# Patient Record
Sex: Male | Born: 1955 | Race: Black or African American | Hispanic: No | Smoking: Never smoker
Health system: Southern US, Community
[De-identification: ages and names within clinical notes are randomized; demographics above are authoritative.]

## PROBLEM LIST (undated history)

## (undated) DIAGNOSIS — C801 Malignant (primary) neoplasm, unspecified: Secondary | ICD-10-CM

## (undated) DIAGNOSIS — C61 Malignant neoplasm of prostate: Secondary | ICD-10-CM

## (undated) DIAGNOSIS — Z21 Asymptomatic human immunodeficiency virus [HIV] infection status: Secondary | ICD-10-CM

## (undated) DIAGNOSIS — I4892 Unspecified atrial flutter: Secondary | ICD-10-CM

## (undated) DIAGNOSIS — B20 Human immunodeficiency virus [HIV] disease: Secondary | ICD-10-CM

## (undated) DIAGNOSIS — I10 Essential (primary) hypertension: Secondary | ICD-10-CM

## (undated) HISTORY — PX: BACK SURGERY: SHX140

## (undated) HISTORY — PX: CARPAL TUNNEL RELEASE: SHX101

---

## 2009-10-13 ENCOUNTER — Emergency Department (HOSPITAL_COMMUNITY): Admission: EM | Admit: 2009-10-13 | Discharge: 2009-10-13 | Payer: Self-pay | Admitting: Emergency Medicine

## 2011-02-02 ENCOUNTER — Ambulatory Visit: Payer: Self-pay

## 2011-03-25 LAB — URINALYSIS, ROUTINE W REFLEX MICROSCOPIC
Glucose, UA: NEGATIVE mg/dL
Hgb urine dipstick: NEGATIVE
Protein, ur: NEGATIVE mg/dL
Specific Gravity, Urine: 1.024 (ref 1.005–1.030)
pH: 6 (ref 5.0–8.0)

## 2011-03-25 LAB — URINE MICROSCOPIC-ADD ON

## 2013-12-16 ENCOUNTER — Emergency Department: Payer: Self-pay | Admitting: Emergency Medicine

## 2013-12-18 ENCOUNTER — Emergency Department (HOSPITAL_COMMUNITY)
Admission: EM | Admit: 2013-12-18 | Discharge: 2013-12-18 | Disposition: A | Discharge status: Home / Self Care | Payer: Medicare Other | Attending: Emergency Medicine | Admitting: Emergency Medicine

## 2013-12-18 ENCOUNTER — Encounter (HOSPITAL_COMMUNITY): Payer: Self-pay | Admitting: Emergency Medicine

## 2013-12-18 DIAGNOSIS — M25579 Pain in unspecified ankle and joints of unspecified foot: Secondary | ICD-10-CM | POA: Insufficient documentation

## 2013-12-18 DIAGNOSIS — M79672 Pain in left foot: Secondary | ICD-10-CM

## 2013-12-18 DIAGNOSIS — I10 Essential (primary) hypertension: Secondary | ICD-10-CM | POA: Insufficient documentation

## 2013-12-18 HISTORY — DX: Essential (primary) hypertension: I10

## 2013-12-18 MED ORDER — BACITRACIN 500 UNIT/GM EX OINT: 1.0000 "application " | TOPICAL_OINTMENT | Freq: Two times a day (BID) | CUTANEOUS | Status: DC
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
## 2013-12-18 NOTE — ED Provider Notes (Signed)
Medical screening examination/treatment/procedure(s) were performed by non-physician practitioner and as supervising physician I was immediately available for consultation/collaboration.  EKG Interpretation   None         Layla Maw Josejuan Hoaglin, DO 12/18/13 [phone]

## 2015-03-01 ENCOUNTER — Emergency Department (INDEPENDENT_AMBULATORY_CARE_PROVIDER_SITE_OTHER)
Admission: EM | Admit: 2015-03-01 | Discharge: 2015-03-01 | Disposition: A | Discharge status: Home / Self Care | Payer: Self-pay | Source: Home / Self Care | Attending: Family Medicine | Admitting: Family Medicine

## 2015-03-01 ENCOUNTER — Encounter (HOSPITAL_COMMUNITY): Payer: Self-pay | Admitting: *Deleted

## 2015-03-01 DIAGNOSIS — T148 Other injury of unspecified body region: Secondary | ICD-10-CM

## 2015-03-01 DIAGNOSIS — T148XXA Other injury of unspecified body region, initial encounter: Secondary | ICD-10-CM
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
## 2015-03-01 NOTE — Discharge Instructions (Signed)
Thank you for coming in today. Use tylenol , ibuprofen or aleve for pain as need.  Take oxycodone when at rest as needed.  Follow up with Dr. Tamala Julian if not better.  Use a heating pad.    Contusion A contusion is the result of an injury to the skin and underlying tissues and is usually caused by direct trauma. The injury results in the appearance of a bruise on the skin overlying the injured tissues. Contusions cause rupture and bleeding of the small capillaries and blood vessels and affect function, because the bleeding infiltrates muscles, tendons, nerves, or other soft tissues.  SYMPTOMS   Swelling and often a hard lump in the injured area, either superficial or deep.  Pain and tenderness over the area of the contusion.  Feeling of firmness when pressure is exerted over the contusion.  Discoloration under the skin, beginning with redness and progressing to the characteristic "black and blue" bruise. CAUSES  A contusion is typically the result of direct trauma. This is often by a blunt object.  RISK INCREASES WITH:  Sports that have a high likelihood of trauma (football, boxing, ice hockey, soccer, field hockey, martial arts, basketball, and baseball).  Sports that make falling from a height likely (high-jumping, pole-vaulting, skating, or gymnastics).  Any bleeding disorder (hemophilia) or taking medications that affect clotting (aspirin, nonsteroidal anti-inflammatory medications, or warfarin [Coumadin]).  Inadequate protection of exposed areas during contact sports. PREVENTION  Maintain physical fitness:  Joint and muscle flexibility.  Strength and endurance.  Coordination.  Wear proper protective equipment. Make sure it fits correctly. PROGNOSIS  Contusions typically heal without any complications. Healing time varies with the severity of injury and intake of medications that affect clotting. Contusions usually heal in 1 to 4 weeks. RELATED COMPLICATIONS   Damage to  nearby nerves or blood vessels, causing numbness, coldness, or paleness.  Compartment syndrome.  Bleeding into the soft tissues that leads to disability.  Infiltrative-type bleeding, leading to the calcification and impaired function of the injured muscle (rare).  Prolonged healing time if usual activities are resumed too soon.  Infection if the skin over the injury site is broken.  Fracture of the bone underlying the contusion.  Stiffness in the joint where the injured muscle crosses. TREATMENT  Treatment initially consists of resting the injured area as well as medication and ice to reduce inflammation. The use of a compression bandage may also be helpful in minimizing inflammation. As pain diminishes and movement is tolerated, the joint where the affected muscle crosses should be moved to prevent stiffness and the shortening (contracture) of the joint. Movement of the joint should begin as soon as possible. It is also important to work on maintaining strength within the affected muscles. Occasionally, extra padding over the area of contusion may be recommended before returning to sports, particularly if re-injury is likely.  MEDICATION   If pain relief is necessary these medications are often recommended:  Nonsteroidal anti-inflammatory medications, such as aspirin and ibuprofen.  Other minor pain relievers, such as acetaminophen, are often recommended.  Prescription pain relievers may be given by your caregiver. Use only as directed and only as much as you need. HEAT AND COLD  Cold treatment (icing) relieves pain and reduces inflammation. Cold treatment should be applied for 10 to 15 minutes every 2 to 3 hours for inflammation and pain and immediately after any activity that aggravates your symptoms. Use ice packs or an ice massage. (To do an ice massage fill a large styrofoam  cup with water and freeze. Tear a small amount of foam from the top so ice protrudes. Massage ice firmly  over the injured area in a circle about the size of a softball.)  Heat treatment may be used prior to performing the stretching and strengthening activities prescribed by your caregiver, physical therapist, or athletic trainer. Use a heat pack or a warm soak. SEEK MEDICAL CARE IF:   Symptoms get worse or do not improve despite treatment in a few days.  You have difficulty moving a joint.  Any extremity becomes extremely painful, numb, pale, or cool (This is an emergency!).  Medication produces any side effects (bleeding, upset stomach, or allergic reaction).  Signs of infection (drainage from skin, headache, muscle aches, dizziness, fever, or general ill feeling) occur if skin was broken. Document Released: 12/06/2005 Document Revised: 02/28/2012 Document Reviewed: 03/20/2009 [facility] Patient Information 2015 [place], [place]. This information is not intended to replace advice given to you by your health care provider. Make sure you discuss any questions you have with your health care provider.

## 2015-03-24 ENCOUNTER — Ambulatory Visit (INDEPENDENT_AMBULATORY_CARE_PROVIDER_SITE_OTHER): Payer: Medicare Other | Admitting: Family Medicine

## 2015-03-24 ENCOUNTER — Encounter: Payer: Self-pay | Admitting: Family Medicine

## 2015-03-24 VITALS — BP 146/88 | HR 78 | Ht 73.0 in | Wt 271.0 lb

## 2015-03-24 DIAGNOSIS — M7061 Trochanteric bursitis, right hip: Secondary | ICD-10-CM | POA: Diagnosis not present

## 2015-03-24 DIAGNOSIS — M6248 Contracture of muscle, other site: Secondary | ICD-10-CM

## 2015-03-24 DIAGNOSIS — M62838 Other muscle spasm: Secondary | ICD-10-CM | POA: Insufficient documentation

## 2015-03-24 MED ORDER — PREDNISONE 50 MG PO TABS: 50.0000 mg | ORAL_TABLET | Freq: Every day | ORAL | Status: DC

## 2015-03-24 NOTE — Assessment & Plan Note (Signed)
Patient has still some tightness of the neck overall. Because patient does need to get back to work patient elected to try prednisone. I do not feel that further imaging is necessary with patient not having any radicular symptoms at this time. 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 We discussed that patient needs to stop the medication and can switch to anti-inflammatories. We discussed icing regimen. Patient given home exercises well. Patient come back in 1 week. At that time as long as patient is off the pain medication is feeling much better we'll get him back to his work.

## 2015-03-24 NOTE — Assessment & Plan Note (Signed)
Discussed with patient that this is more secondary to his inactivity. Patient given some home exercises to try to increase patient's activity. Patient had a negative straight leg test. There is a possibility with the differential of including adjacent segment disease with patient having a lumbar fusion multiple years ago. Patient having worsening symptoms advance imaging may be necessary. Patient though was given prednisone and hopefully will respond well. We discussed the icing regimen as well as topical anti-inflammatories. Patient and will come back and see me again in 1 week and if pain is still there will consider injection.

## 2015-03-24 NOTE — Patient Instructions (Addendum)
Good to see you Ice 20 minutes 2 times daily. Usually after activity and before bed. Exercises 3 times a week. Alternate neck and hip Vitamin D 2000 IU daily Turmeric 500mg  twice daily Prednisone daily for 5 days Stop oxycodone and valium.  See me again next Tuesday and we will get you back to work.

## 2015-03-24 NOTE — Progress Notes (Signed)
Pre visit review using our clinic review tool, if applicable. No additional management support is needed unless otherwise documented below in the visit note. 
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
## 2015-04-01 ENCOUNTER — Ambulatory Visit: Payer: Medicare Other | Admitting: Family Medicine

## 2015-05-18 ENCOUNTER — Encounter (HOSPITAL_COMMUNITY): Payer: Self-pay

## 2015-05-18 ENCOUNTER — Emergency Department (HOSPITAL_COMMUNITY): Payer: Medicare Other

## 2015-05-18 ENCOUNTER — Emergency Department (HOSPITAL_COMMUNITY)
Admission: EM | Admit: 2015-05-18 | Discharge: 2015-05-18 | Disposition: A | Discharge status: Home / Self Care | Payer: Medicare Other | Attending: Emergency Medicine | Admitting: Emergency Medicine

## 2015-05-18 DIAGNOSIS — Z79899 Other long term (current) drug therapy: Secondary | ICD-10-CM | POA: Insufficient documentation

## 2015-05-18 DIAGNOSIS — I10 Essential (primary) hypertension: Secondary | ICD-10-CM | POA: Insufficient documentation

## 2015-05-18 DIAGNOSIS — Z8546 Personal history of malignant neoplasm of prostate: Secondary | ICD-10-CM | POA: Diagnosis not present

## 2015-05-18 DIAGNOSIS — M549 Dorsalgia, unspecified: Secondary | ICD-10-CM | POA: Insufficient documentation

## 2015-05-18 DIAGNOSIS — R319 Hematuria, unspecified: Secondary | ICD-10-CM

## 2015-05-18 DIAGNOSIS — R109 Unspecified abdominal pain: Secondary | ICD-10-CM

## 2015-05-18 DIAGNOSIS — Z7982 Long term (current) use of aspirin: Secondary | ICD-10-CM | POA: Diagnosis not present

## 2015-05-18 DIAGNOSIS — Z21 Asymptomatic human immunodeficiency virus [HIV] infection status: Secondary | ICD-10-CM | POA: Diagnosis not present

## 2015-05-18 HISTORY — DX: Asymptomatic human immunodeficiency virus (hiv) infection status: Z21

## 2015-05-18 HISTORY — DX: Human immunodeficiency virus (HIV) disease: B20

## 2015-05-18 HISTORY — DX: Malignant (primary) neoplasm, unspecified: C80.1

## 2015-05-18 HISTORY — DX: Malignant neoplasm of prostate: C61

## 2015-05-18 LAB — URINALYSIS, ROUTINE W REFLEX MICROSCOPIC
BILIRUBIN URINE: NEGATIVE
Glucose, UA: NEGATIVE mg/dL
KETONES UR: NEGATIVE mg/dL
NITRITE: NEGATIVE
PROTEIN: NEGATIVE mg/dL
Specific Gravity, Urine: 1.026 (ref 1.005–1.030)
Urobilinogen, UA: 0.2 mg/dL (ref 0.0–1.0)
pH: 5.5 (ref 5.0–8.0)

## 2015-05-18 LAB — I-STAT CHEM 8, ED
BUN: 16 mg/dL (ref 6–20)
CREATININE: 1.3 mg/dL — AB (ref 0.61–1.24)
Calcium, Ion: 1.26 mmol/L — ABNORMAL HIGH (ref 1.12–1.23)
Chloride: 103 mmol/L (ref 101–111)
Glucose, Bld: 104 mg/dL — ABNORMAL HIGH (ref 65–99)
HCT: 46 % (ref 39.0–52.0)
HEMOGLOBIN: 15.6 g/dL (ref 13.0–17.0)
Potassium: 4 mmol/L (ref 3.5–5.1)
SODIUM: 141 mmol/L (ref 135–145)
TCO2: 25 mmol/L (ref 0–100)

## 2015-05-18 LAB — CBC WITH DIFFERENTIAL/PLATELET
Basophils Absolute: 0 10*3/uL (ref 0.0–0.1)
Basophils Relative: 0 % (ref 0–1)
Eosinophils Absolute: 0 10*3/uL (ref 0.0–0.7)
Eosinophils Relative: 0 % (ref 0–5)
HCT: 40.9 % (ref 39.0–52.0)
Hemoglobin: 14.3 g/dL (ref 13.0–17.0)
LYMPHS ABS: 1.3 10*3/uL (ref 0.7–4.0)
LYMPHS PCT: 19 % (ref 12–46)
MCH: 30.4 pg (ref 26.0–34.0)
MCHC: 35 g/dL (ref 30.0–36.0)
MCV: 86.8 fL (ref 78.0–100.0)
Monocytes Absolute: 0.9 10*3/uL (ref 0.1–1.0)
Monocytes Relative: 13 % — ABNORMAL HIGH (ref 3–12)
NEUTROS ABS: 4.6 10*3/uL (ref 1.7–7.7)
Neutrophils Relative %: 68 % (ref 43–77)
PLATELETS: 197 10*3/uL (ref 150–400)
RBC: 4.71 MIL/uL (ref 4.22–5.81)
RDW: 12.9 % (ref 11.5–15.5)
WBC: 6.8 10*3/uL (ref 4.0–10.5)

## 2015-05-18 LAB — URINE MICROSCOPIC-ADD ON

## 2015-05-18 NOTE — ED Notes (Signed)
Patient c/o right flank pain and hematuria x 5 days. Patient states he had the same symptoms a month ago.

## 2015-05-18 NOTE — Discharge Instructions (Signed)
Continue your pain medications. Please follow up with urology for further evaluation of your hematuria. Return if worsening.    Flank Pain Flank pain refers to pain that is located on the side of the body between the upper abdomen and the back. The pain may occur over a short period of time (acute) or may be long-term or reoccurring (chronic). It may be mild or severe. Flank pain can be caused by many things. CAUSES  Some of the more common causes of flank pain include:  Muscle strains.   Muscle spasms.   A disease of your spine (vertebral disk disease).   A lung infection (pneumonia).   Fluid around your lungs (pulmonary edema).   A kidney infection.   Kidney stones.   A very painful skin rash caused by the chickenpox virus (shingles).   Gallbladder disease.  Iva care will depend on the cause of your pain. In general,  Rest as directed by your caregiver.  Drink enough fluids to keep your urine clear or pale yellow.  Only take over-the-counter or prescription medicines as directed by your caregiver. Some medicines may help relieve the pain.  Tell your caregiver about any changes in your pain.  Follow up with your caregiver as directed. SEEK IMMEDIATE MEDICAL CARE IF:   Your pain is not controlled with medicine.   You have new or worsening symptoms.  Your pain increases.   You have abdominal pain.   You have shortness of breath.   You have persistent nausea or vomiting.   You have swelling in your abdomen.   You feel faint or pass out.   You have blood in your urine.  You have a fever or persistent symptoms for more than 2-3 days.  You have a fever and your symptoms suddenly get worse. MAKE SURE YOU:   Understand these instructions.  Will watch your condition.  Will get help right away if you are not doing well or get worse. Document Released: 01/27/2006 Document Revised: 08/30/2012 Document Reviewed:  07/20/2012 [facility] Patient Information 2015 [place], [place]. This information is not intended to replace advice given to you by your health care provider. Make sure you discuss any questions you have with your health care provider.

## 2015-05-18 NOTE — ED Provider Notes (Signed)
CSN: [id number]     Arrival date & time 05/18/15  1053 History   First MD Initiated Contact with Patient 05/18/15 1110     Chief Complaint  Patient presents with  . Hematuria  . Flank Pain     (Consider location/radiation/quality/duration/timing/severity/associated sxs/prior Treatment) HPI Michael Hubbard is a 59 y.o. male with history of HIV, hypertension, prostate cancer, currently not on any treatment, presents to emergency department complaining of  flank pain and hematuria. Patient states he notices blood in his urine that started 5 days ago. States this was followed by pain in the right flank. He states he called his doctor today and he was told to come to emergency department. Patient denies any fever or chills. He denies any nausea or vomiting. He denies any urinary symptoms. He states bleeding actually cleared up 2 days ago. He continues to have dull right flank pain. He states sometimes pain is sharp and radiated to the lower abdomen. States pain is currently controlled. He denies being on any blood thinners. He states he is currently being evaluated for new diagnosis of prostate cancer, has not started treatment yet. Followed by urologist and Baldo Ash.  Past Medical History  Diagnosis Date  . Hypertension   . HIV (human immunodeficiency virus infection)   . Cancer   . Prostate cancer    Past Surgical History  Procedure Laterality Date  . Back surgery    . Carpal tunnel release     History reviewed. No pertinent family history. History  Substance Use Topics  . Smoking status: Never Smoker   . Smokeless tobacco: Never Used  . Alcohol Use: Yes     Comment: occasionally    Review of Systems  Constitutional: Negative for fever and chills.  Respiratory: Negative for cough, chest tightness and shortness of breath.   Cardiovascular: Negative for chest pain, palpitations and leg swelling.  Gastrointestinal: Positive for abdominal pain. Negative for nausea, vomiting, diarrhea  and abdominal distention.  Genitourinary: Positive for flank pain. Negative for dysuria, urgency, frequency, hematuria, penile swelling, scrotal swelling, difficulty urinating, penile pain and testicular pain.  Musculoskeletal: Positive for back pain. Negative for myalgias, arthralgias, neck pain and neck stiffness.  Skin: Negative for rash.  Allergic/Immunologic: Negative for immunocompromised state.  Neurological: Negative for dizziness, weakness, light-headedness, numbness and headaches.      Allergies  Review of patient's allergies indicates no known allergies.  Home Medications   Prior to Admission medications   Medication Sig Start Date End Date Taking? Authorizing Provider  aspirin 325 MG tablet Take 325 mg by mouth daily.   Yes Historical Provider, MD  ATRIPLA 600-200-300 MG per tablet Take 1 tablet by mouth daily. 02/25/15  Yes Historical Provider, MD  enalapril (VASOTEC) 10 MG tablet Take 10 mg by mouth daily.   Yes Historical Provider, MD  gabapentin (NEURONTIN) 300 MG capsule Take 300 mg by mouth 3 (three) times daily. 01/26/15  Yes Historical Provider, MD  losartan-hydrochlorothiazide (HYZAAR) 100-12.5 MG per tablet Take 1 tablet by mouth daily. 02/16/15  Yes Historical Provider, MD  oxyCODONE-acetaminophen (PERCOCET) 10-325 MG per tablet Take 1 tablet by mouth every 8 (eight) hours as needed for pain. for pain 03/15/15  Yes Historical Provider, MD  valACYclovir (VALTREX) 500 MG tablet Take 500 mg by mouth every 12 (twelve) hours. 03/10/15  Yes Historical Provider, MD  verapamil (CALAN-SR) 240 MG CR tablet Take 240 mg by mouth at bedtime.   Yes Historical Provider, MD  predniSONE (DELTASONE) 50 MG tablet  Take 1 tablet (50 mg total) by mouth daily. Patient not taking: Reported on 05/18/2015 03/24/15   Lyndal Pulley, DO   BP 152/87 mmHg  Pulse 92  Temp(Src) 98.3 F (36.8 C) (Oral)  Resp 18  Ht 6' (1.829 m)  Wt 267 lb (121.11 kg)  BMI 36.20 kg/m2  SpO2 95% Physical Exam   Constitutional: He is oriented to person, place, and time. He appears well-developed and well-nourished. No distress.  HENT:  Head: Normocephalic and atraumatic.  Eyes: Conjunctivae are normal.  Neck: Neck supple.  Cardiovascular: Normal rate, regular rhythm and normal heart sounds.   Pulmonary/Chest: Effort normal. No respiratory distress. He has no wheezes. He has no rales.  Abdominal: Soft. Bowel sounds are normal. He exhibits no distension. There is no tenderness. There is no rebound and no guarding.  Mild right CVA tenderness  Musculoskeletal: He exhibits no edema.  Neurological: He is alert and oriented to person, place, and time.  Skin: Skin is warm and dry.  Nursing note and vitals reviewed.   ED Course  Procedures (including critical care time) Labs Review Labs Reviewed  URINALYSIS, ROUTINE W REFLEX MICROSCOPIC (NOT AT [facility]) - Abnormal; Notable for the following:    Hgb urine dipstick TRACE (*)    Leukocytes, UA SMALL (*)    All other components within normal limits  CBC WITH DIFFERENTIAL/PLATELET - Abnormal; Notable for the following:    Monocytes Relative 13 (*)    All other components within normal limits  URINE MICROSCOPIC-ADD ON - Abnormal; Notable for the following:    Squamous Epithelial / LPF FEW (*)    All other components within normal limits  I-STAT CHEM 8, ED - Abnormal; Notable for the following:    Creatinine, Ser 1.30 (*)    Glucose, Bld 104 (*)    Calcium, Ion 1.26 (*)    All other components within normal limits  URINE CULTURE    Imaging Review Ct Abdomen Pelvis Wo Contrast  05/18/2015   CLINICAL DATA:  Right flank pain, hematuria for 5 days  EXAM: CT ABDOMEN AND PELVIS WITHOUT CONTRAST  TECHNIQUE: Multidetector CT imaging of the abdomen and pelvis was performed following the standard protocol without IV contrast.  COMPARISON:  None.  FINDINGS: There are streaky artifacts from patient's large body habitus. Metallic posterior fixation noted at L4-L5  level lumbar spine.  The lung bases are unremarkable. Unenhanced liver shows no biliary ductal dilatation. At least 3 hepatic cysts are noted the largest in left hepatic lobe laterally measures 5 mm.  Unenhanced pancreas, spleen and adrenal glands are unremarkable. There is a cyst in upper pole of the left kidney measures 4.9 cm. Cyst in midpole of the left kidney measures 1.3 cm. Probable cortical cyst in midpole of the right kidney measures 8 mm. There is a cyst in lower pole of the right kidney measures 2.8 cm. At least 2 nonobstructive calcifications are noted within left kidney the largest in midpole measures 8 mm.  No hydronephrosis or hydroureter. No calcified ureteral calculi are noted bilaterally.  There is no small bowel obstruction. No ascites or free air. No adenopathy. No pericecal inflammation. Normal retrocecal appendix visualized in axial image 54.  Bilateral distal ureter is unremarkable. Nonspecific mild thickening of the wall of under distended urinary bladder. Small prostate gland calcifications are noted. The seminal vesicles are unremarkable. No calcified calculi are noted within urinary bladder.  Degenerative changes bilateral SI joints.  IMPRESSION: 1. There is left nonobstructive nephrolithiasis. No hydronephrosis  or hydroureter. No calcified ureteral calculi are noted. 2. Bilateral renal cysts as described above. 3. No pericecal inflammation.  Normal appendix partially visualized. 4. No small bowel obstruction. 5. Posterior fusion lumbar spine at L4-L5 level. 6. Nonspecific mild thickening of the wall of under distended urinary bladder.   Electronically Signed   By: Lahoma Crocker M.D.   On: 05/18/2015 13:34     EKG Interpretation None      MDM   Final diagnoses:  Flank pain  Hematuria    patient with right flank pain, pain is dull, intermittently sharp. Radiates into the right lower abdomen. He admits to hematuria which has now resolved. History of prostate cancer, not any  treatment at this time. Not on blood thinners. We'll get labs, urinalysis.  2:45 PM Pt's labs and UA unremarkable except for mildly elevated creatinine of 1.3, trace hgb. 11-20 WBCs in urine, rare bacteria. Will send cultures. No clear urinary tract infection. Cultures sent. Patient is in no acute distress. Vital signs are normal, he is afebrile. No acute findings on CT scan. Plan to discharge home, follow up with his doctors in [place]. Patient given strict return precautions.  Filed Vitals:   05/18/15 1101  BP: 152/87  Pulse: 92  Temp: 98.3 F (36.8 C)  TempSrc: Oral  Resp: 18  Height: 6' (1.829 m)  Weight: 267 lb (121.11 kg)  SpO2: 95%     Jeannett Senior, PA-C 05/18/15 1447  Daleen Bo, MD 05/18/15 1457

## 2015-05-19 LAB — URINE CULTURE
Colony Count: NO GROWTH
Culture: NO GROWTH

## 2015-07-10 IMAGING — CT CT ABD-PELV W/O CM
2 of 3 series · 16 of 46 positions shown, 18 images · non-contrast
Comparison: None.

CLINICAL DATA: Right flank pain, hematuria for 5 days

EXAM:
CT ABDOMEN AND PELVIS WITHOUT CONTRAST
TECHNIQUE: Multidetector CT imaging of the abdomen and pelvis was performed
following the standard protocol without IV contrast.

[Series 3: coronal · coronal · 0.74mm/px · 3 of 127 slices shown]
[im 43/127  soft-tissue]
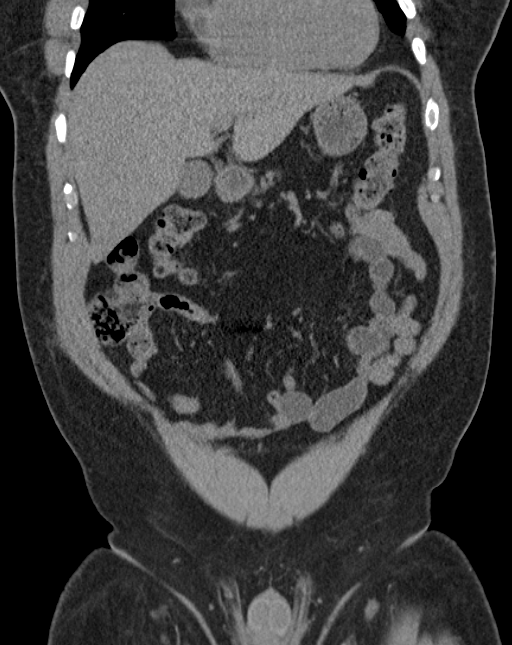
[im 57/127  soft-tissue]
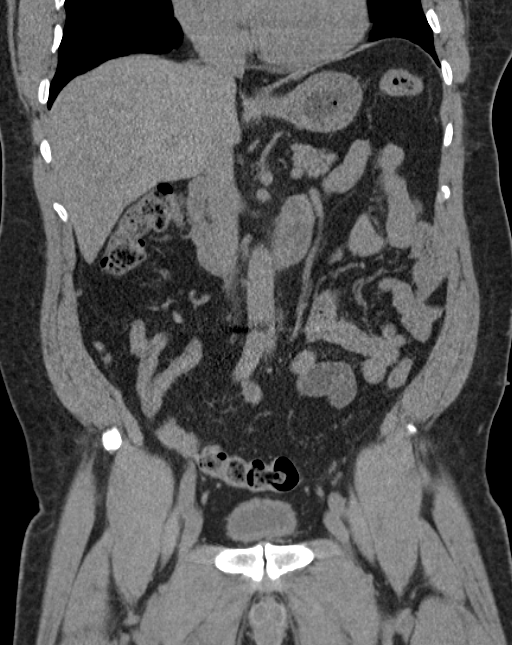
[im 71/127  soft-tissue]
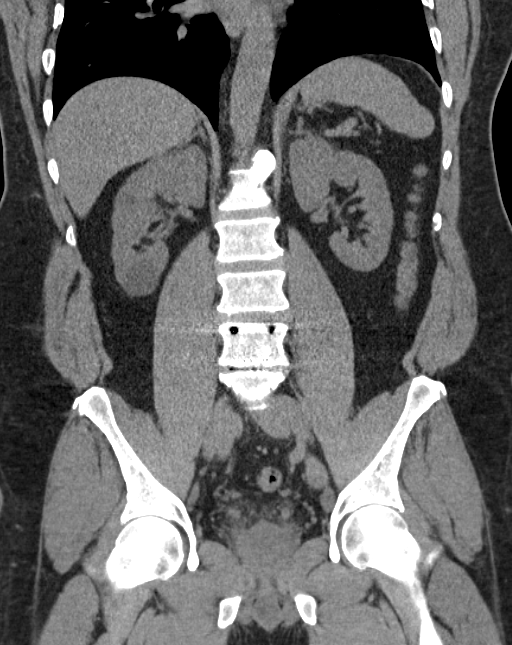

[Series 6: lung · axial · 0.80mm/px · z∈[-240,-100]mm · 13 of 34 slices shown, 15 images]
[im 3/34  soft-tissue]
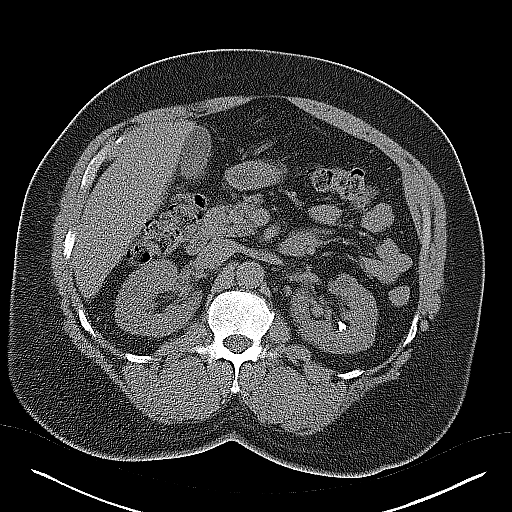
[im 3/34  bone]
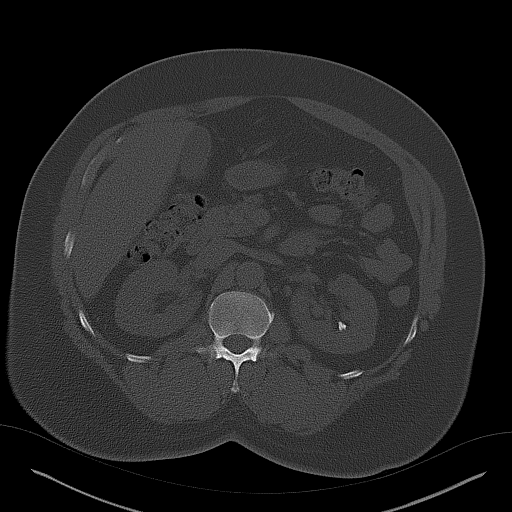
[im 5/34  soft-tissue]
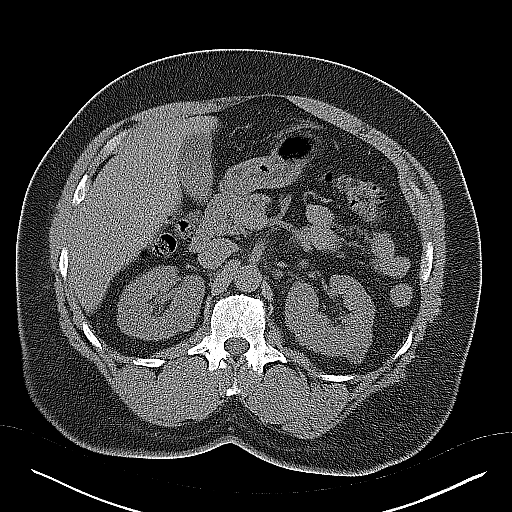
[im 7/34  soft-tissue]
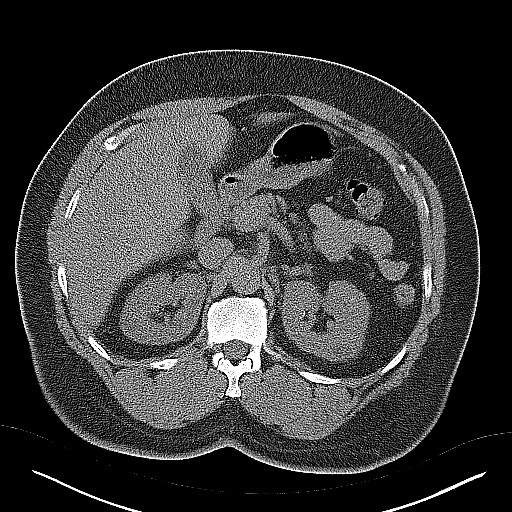
[im 10/34  soft-tissue]
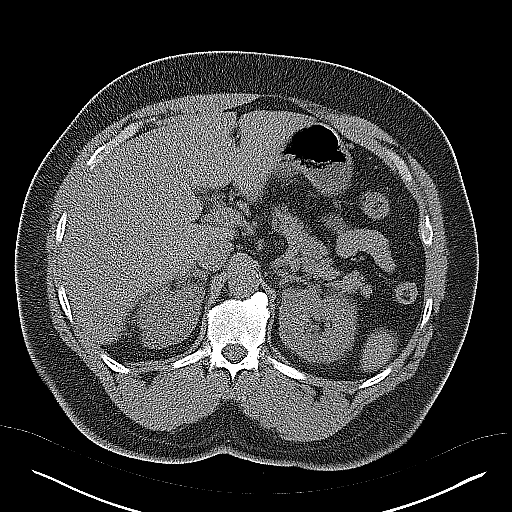
[im 12/34  soft-tissue]
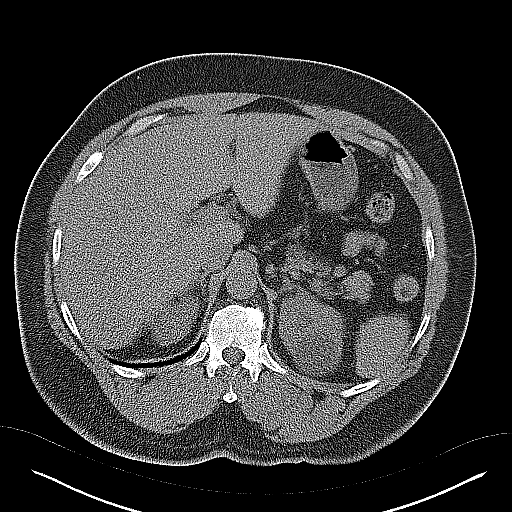
[im 14/34  soft-tissue]
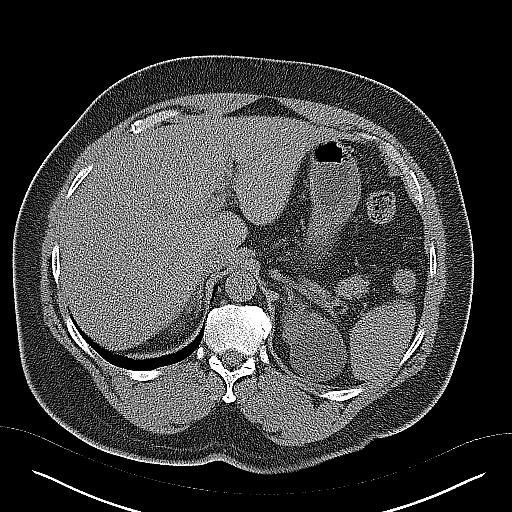
[im 18/34  soft-tissue]
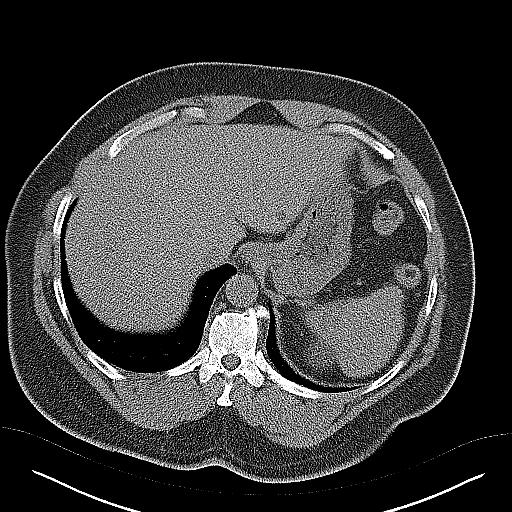
[im 20/34  soft-tissue]
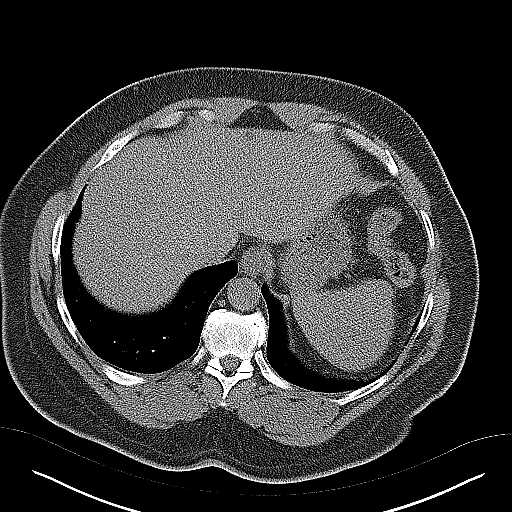
[im 22/34  soft-tissue]
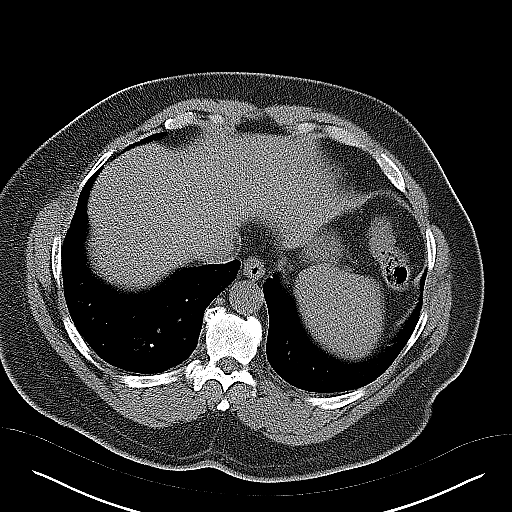
[im 22/34  bone]
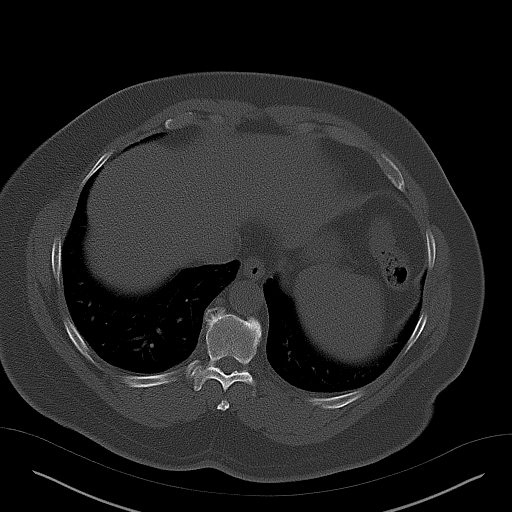
[im 24/34  soft-tissue]
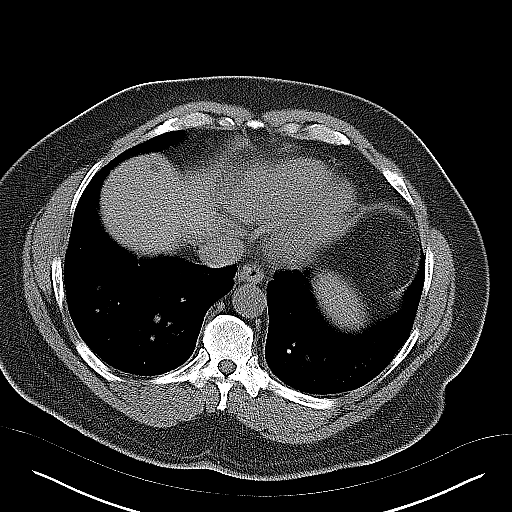
[im 27/34  soft-tissue]
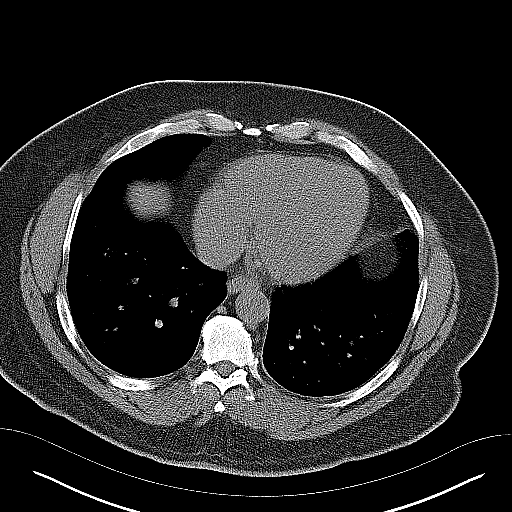
[im 29/34  soft-tissue]
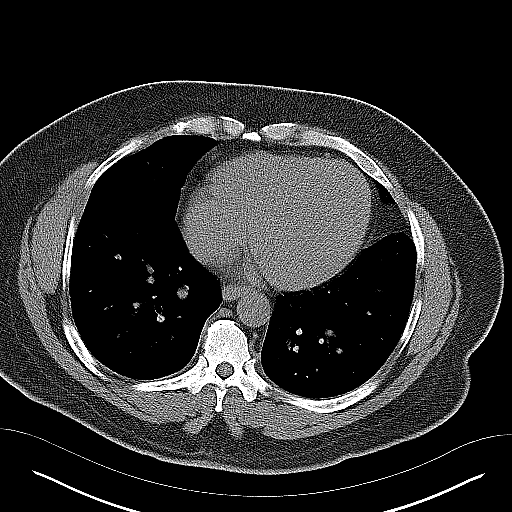
[im 31/34  soft-tissue]
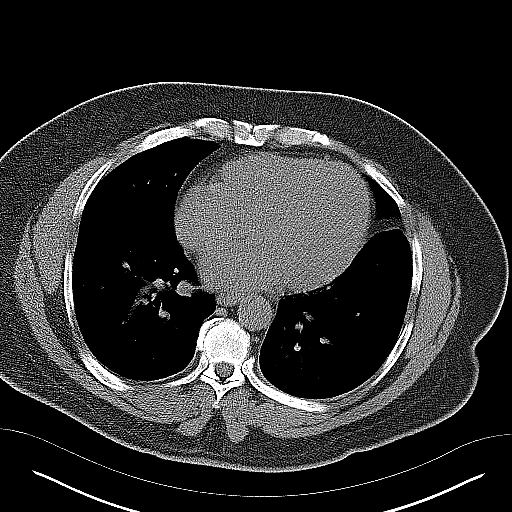

[16 of 46 positions shown; findings below may reference images not displayed]

FINDINGS: There are streaky artifacts from patient's large body habitus.
Metallic posterior fixation noted at L4-L5 level lumbar spine.

The lung bases are unremarkable. Unenhanced liver shows no biliary
ductal dilatation. At least 3 hepatic cysts are noted the largest in
left hepatic lobe laterally measures 5 mm.

Unenhanced pancreas, spleen and adrenal glands are unremarkable.
There is a cyst in upper pole of the left kidney measures 4.9 cm.
Cyst in midpole of the left kidney measures 1.3 cm. Probable
cortical cyst in midpole of the right kidney measures 8 mm. There is
a cyst in lower pole of the right kidney measures 2.8 cm. At least 2
nonobstructive calcifications are noted within left kidney the
largest in midpole measures 8 mm.

No hydronephrosis or hydroureter. No calcified ureteral calculi are
noted bilaterally.

There is no small bowel obstruction. No ascites or free air. No
adenopathy. No pericecal inflammation. Normal retrocecal appendix
visualized in axial image 54.

Bilateral distal ureter is unremarkable. Nonspecific mild thickening
of the wall of under distended urinary bladder. Small prostate gland
calcifications are noted. The seminal vesicles are unremarkable. No
calcified calculi are noted within urinary bladder.

Degenerative changes bilateral SI joints.
IMPRESSION: 1. There is left nonobstructive nephrolithiasis. No hydronephrosis
or hydroureter. No calcified ureteral calculi are noted.
2. Bilateral renal cysts as described above.
3. No pericecal inflammation.  Normal appendix partially visualized.
4. No small bowel obstruction.
5. Posterior fusion lumbar spine at L4-L5 level.
6. Nonspecific mild thickening of the wall of under distended
urinary bladder.

## 2015-11-29 ENCOUNTER — Ambulatory Visit (INDEPENDENT_AMBULATORY_CARE_PROVIDER_SITE_OTHER): Payer: Self-pay | Admitting: Physician Assistant

## 2015-11-29 VITALS — BP 124/70 | HR 86 | Temp 98.6°F | Resp 16 | Ht 72.0 in | Wt 275.0 lb

## 2015-11-29 DIAGNOSIS — Z021 Encounter for pre-employment examination: Secondary | ICD-10-CM

## 2015-11-29 DIAGNOSIS — Z0289 Encounter for other administrative examinations: Secondary | ICD-10-CM

## 2015-11-29 NOTE — Progress Notes (Signed)
[facility] [address] [place] [address] [phone]  Date:  11/29/2015   Name:  Michael Hubbard   DOB:  1956/02/11   MRN:  [id number]  PCP:  No primary care provider on file.    History of Present Illness:  Michael Hubbard is a 59 y.o. male patient who presents to [facility] is here for DOT exam and completion of the form. He has no concerns or complaints at this time. Pertinent ros are denied. 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   Patient Active Problem List   Diagnosis Date Noted  . Neck muscle spasm 03/24/2015  . Greater trochanteric bursitis of right hip 03/24/2015    Past Medical History  Diagnosis Date  . Hypertension   . HIV (human immunodeficiency virus infection) ([place])   . Cancer ([place])   . Prostate cancer [facility])     Past Surgical History  Procedure Laterality Date  . Back surgery    . Carpal tunnel release      Social History  Substance Use Topics  . Smoking status: Never Smoker   . Smokeless tobacco: Never Used  . Alcohol Use: Yes     Comment: occasionally    No family history on file.  No Known Allergies  Medication list has been reviewed and updated.  Current Outpatient Prescriptions on File Prior to Visit  Medication Sig Dispense Refill  . aspirin 325 MG tablet Take 325 mg by mouth daily.    . ATRIPLA 600-200-300 MG per tablet Take 1 tablet by mouth daily.  4  . enalapril (VASOTEC) 10 MG tablet Take 10 mg by mouth daily.    Marland Kitchen gabapentin (NEURONTIN) 300 MG capsule Take 300 mg by mouth 3 (three) times daily.  1  . losartan-hydrochlorothiazide (HYZAAR) 100-12.5 MG per tablet Take 1 tablet by mouth daily.  0  . oxyCODONE-acetaminophen (PERCOCET) 10-325 MG per tablet Take 1 tablet by mouth every 8 (eight) hours as needed for pain. for pain  0  . predniSONE (DELTASONE) 50 MG tablet Take 1 tablet (50 mg total) by mouth daily. (Patient not taking: Reported on 05/18/2015) 5 tablet 0  . valACYclovir  (VALTREX) 500 MG tablet Take 500 mg by mouth every 12 (twelve) hours.  4  . verapamil (CALAN-SR) 240 MG CR tablet Take 240 mg by mouth at bedtime.     No current facility-administered medications on file prior to visit.    Review of Systems  Constitutional: Negative for fever and chills.  HENT: Negative for ear discharge, ear pain and sore throat.   Eyes: Negative for blurred vision and double vision.  Respiratory: Negative for cough, shortness of breath and wheezing.   Cardiovascular: Negative for chest pain, palpitations and leg swelling.  Gastrointestinal: Negative for nausea, vomiting and diarrhea.  Genitourinary: Negative for dysuria, frequency and hematuria.  Skin: Negative for itching and rash.  Neurological: Negative for dizziness and headaches.    Physical Examination: BP 124/70 mmHg  Pulse 86  Temp(Src) 98.6 F (37 C) (Oral)  Resp 16  Ht 6' (1.829 m)  Wt 275 lb (124.739 kg)  BMI 37.29 kg/m2  SpO2 98% Ideal Body Weight: Weight in (lb) to have BMI = 25: 183.9  Physical Exam  Constitutional: He is oriented to person, place, and time. He appears well-developed and well-nourished. No distress.  HENT:  Head: Normocephalic and atraumatic.  Right Ear: Tympanic membrane, external ear and ear canal normal.  Left Ear: Tympanic membrane, external ear and ear canal normal.  Eyes: Conjunctivae and EOM are normal. Pupils are equal, round, and reactive to light.  Cardiovascular: Normal rate and regular rhythm.  Exam reveals no friction rub.   No murmur heard. Pulmonary/Chest: Effort normal. No respiratory distress. He has no wheezes.  Abdominal: Soft. Bowel sounds are normal. He exhibits no distension and no mass. There is no tenderness. Hernia confirmed negative in the right inguinal area and confirmed negative in the left inguinal area.  Genitourinary: Testes normal and penis normal. Right testis shows no mass and no tenderness. Left testis shows no mass and no tenderness.   Musculoskeletal: Normal range of motion. He exhibits no edema or tenderness.  Neurological: He is alert and oriented to person, place, and time. He displays normal reflexes.  Skin: Skin is warm and dry. He is not diaphoretic.  Psychiatric: He has a normal mood and affect. His behavior is normal.      Assessment and Plan: Michael Hubbard is a 59 y.o. male who is here today for DOT exam.  Encounter for examination required by Department of Transportation (DOT)  -trace protein and blood found on urine dipstick.  He was advised to seek follow up with PCP or here for further evaluation of this.   Ivar Drape, PA-C [facility] 12/11/20169:25 PM   Ivar Drape, PA-C [facility] 11/29/2015 2:25 PM

## 2015-11-29 NOTE — Patient Instructions (Signed)
Please follow up with your primary care on this trace blood in urine, and protein.

## 2015-11-30 ENCOUNTER — Encounter: Payer: Self-pay | Admitting: Physician Assistant

## 2017-02-13 ENCOUNTER — Inpatient Hospital Stay (HOSPITAL_COMMUNITY)
Admission: EM | Admit: 2017-02-13 | Discharge: 2017-02-16 | DRG: 287 | Disposition: A | Discharge status: Home / Self Care | Payer: Medicare Other | Attending: Internal Medicine | Admitting: Internal Medicine

## 2017-02-13 ENCOUNTER — Encounter (HOSPITAL_COMMUNITY): Payer: Self-pay

## 2017-02-13 ENCOUNTER — Emergency Department (HOSPITAL_COMMUNITY): Payer: Medicare Other

## 2017-02-13 DIAGNOSIS — R7989 Other specified abnormal findings of blood chemistry: Secondary | ICD-10-CM | POA: Diagnosis present

## 2017-02-13 DIAGNOSIS — R42 Dizziness and giddiness: Secondary | ICD-10-CM | POA: Diagnosis not present

## 2017-02-13 DIAGNOSIS — I4891 Unspecified atrial fibrillation: Secondary | ICD-10-CM | POA: Diagnosis present

## 2017-02-13 DIAGNOSIS — I130 Hypertensive heart and chronic kidney disease with heart failure and stage 1 through stage 4 chronic kidney disease, or unspecified chronic kidney disease: Secondary | ICD-10-CM | POA: Diagnosis present

## 2017-02-13 DIAGNOSIS — I959 Hypotension, unspecified: Secondary | ICD-10-CM | POA: Diagnosis not present

## 2017-02-13 DIAGNOSIS — I10 Essential (primary) hypertension: Secondary | ICD-10-CM | POA: Diagnosis not present

## 2017-02-13 DIAGNOSIS — I5022 Chronic systolic (congestive) heart failure: Secondary | ICD-10-CM

## 2017-02-13 DIAGNOSIS — R778 Other specified abnormalities of plasma proteins: Secondary | ICD-10-CM

## 2017-02-13 DIAGNOSIS — E785 Hyperlipidemia, unspecified: Secondary | ICD-10-CM | POA: Diagnosis not present

## 2017-02-13 DIAGNOSIS — R748 Abnormal levels of other serum enzymes: Secondary | ICD-10-CM | POA: Diagnosis not present

## 2017-02-13 DIAGNOSIS — E876 Hypokalemia: Secondary | ICD-10-CM | POA: Diagnosis not present

## 2017-02-13 DIAGNOSIS — E872 Acidosis, unspecified: Secondary | ICD-10-CM

## 2017-02-13 DIAGNOSIS — E860 Dehydration: Secondary | ICD-10-CM | POA: Diagnosis present

## 2017-02-13 DIAGNOSIS — I248 Other forms of acute ischemic heart disease: Secondary | ICD-10-CM | POA: Diagnosis present

## 2017-02-13 DIAGNOSIS — I4892 Unspecified atrial flutter: Secondary | ICD-10-CM | POA: Diagnosis not present

## 2017-02-13 DIAGNOSIS — I484 Atypical atrial flutter: Principal | ICD-10-CM | POA: Diagnosis present

## 2017-02-13 DIAGNOSIS — Z21 Asymptomatic human immunodeficiency virus [HIV] infection status: Secondary | ICD-10-CM | POA: Diagnosis present

## 2017-02-13 DIAGNOSIS — N179 Acute kidney failure, unspecified: Secondary | ICD-10-CM | POA: Diagnosis not present

## 2017-02-13 DIAGNOSIS — N182 Chronic kidney disease, stage 2 (mild): Secondary | ICD-10-CM | POA: Diagnosis present

## 2017-02-13 DIAGNOSIS — I2510 Atherosclerotic heart disease of native coronary artery without angina pectoris: Secondary | ICD-10-CM | POA: Diagnosis present

## 2017-02-13 DIAGNOSIS — Z79899 Other long term (current) drug therapy: Secondary | ICD-10-CM

## 2017-02-13 DIAGNOSIS — I420 Dilated cardiomyopathy: Secondary | ICD-10-CM | POA: Diagnosis present

## 2017-02-13 DIAGNOSIS — I272 Pulmonary hypertension, unspecified: Secondary | ICD-10-CM | POA: Diagnosis present

## 2017-02-13 DIAGNOSIS — Z8546 Personal history of malignant neoplasm of prostate: Secondary | ICD-10-CM

## 2017-02-13 DIAGNOSIS — I5042 Chronic combined systolic (congestive) and diastolic (congestive) heart failure: Secondary | ICD-10-CM | POA: Diagnosis present

## 2017-02-13 DIAGNOSIS — Z7982 Long term (current) use of aspirin: Secondary | ICD-10-CM

## 2017-02-13 DIAGNOSIS — T502X5A Adverse effect of carbonic-anhydrase inhibitors, benzothiadiazides and other diuretics, initial encounter: Secondary | ICD-10-CM | POA: Diagnosis not present

## 2017-02-13 HISTORY — DX: Unspecified atrial flutter: I48.92

## 2017-02-13 LAB — COMPREHENSIVE METABOLIC PANEL
ALBUMIN: 3.3 g/dL — AB (ref 3.5–5.0)
ALT: 28 U/L (ref 17–63)
ANION GAP: 10 (ref 5–15)
AST: 42 U/L — ABNORMAL HIGH (ref 15–41)
Alkaline Phosphatase: 94 U/L (ref 38–126)
BILIRUBIN TOTAL: 1.5 mg/dL — AB (ref 0.3–1.2)
BUN: 22 mg/dL — ABNORMAL HIGH (ref 6–20)
CO2: 31 mmol/L (ref 22–32)
Calcium: 9.1 mg/dL (ref 8.9–10.3)
Chloride: 98 mmol/L — ABNORMAL LOW (ref 101–111)
Creatinine, Ser: 2.08 mg/dL — ABNORMAL HIGH (ref 0.61–1.24)
GFR calc non Af Amer: 33 mL/min — ABNORMAL LOW (ref >60)
GFR, EST AFRICAN AMERICAN: 39 mL/min — AB (ref >60)
Glucose, Bld: 130 mg/dL — ABNORMAL HIGH (ref 65–99)
POTASSIUM: 2.8 mmol/L — AB (ref 3.5–5.1)
Sodium: 139 mmol/L (ref 135–145)
Total Protein: 6.9 g/dL (ref 6.5–8.1)

## 2017-02-13 LAB — CBC WITH DIFFERENTIAL/PLATELET
BASOS PCT: 0 %
Basophils Absolute: 0 10*3/uL (ref 0.0–0.1)
EOS ABS: 0 10*3/uL (ref 0.0–0.7)
Eosinophils Relative: 0 %
HEMATOCRIT: 47.6 % (ref 39.0–52.0)
Hemoglobin: 17 g/dL (ref 13.0–17.0)
Lymphocytes Relative: 8 %
Lymphs Abs: 0.8 10*3/uL (ref 0.7–4.0)
MCH: 29 pg (ref 26.0–34.0)
MCHC: 35.7 g/dL (ref 30.0–36.0)
MCV: 81.1 fL (ref 78.0–100.0)
MONO ABS: 1.4 10*3/uL — AB (ref 0.1–1.0)
MONOS PCT: 13 %
Neutro Abs: 8.5 10*3/uL — ABNORMAL HIGH (ref 1.7–7.7)
Neutrophils Relative %: 79 %
Platelets: 187 10*3/uL (ref 150–400)
RBC: 5.87 MIL/uL — ABNORMAL HIGH (ref 4.22–5.81)
RDW: 14.9 % (ref 11.5–15.5)
WBC: 10.7 10*3/uL — ABNORMAL HIGH (ref 4.0–10.5)

## 2017-02-13 LAB — URINALYSIS, ROUTINE W REFLEX MICROSCOPIC
BACTERIA UA: NONE SEEN
BILIRUBIN URINE: NEGATIVE
Glucose, UA: NEGATIVE mg/dL
KETONES UR: NEGATIVE mg/dL
Nitrite: NEGATIVE
PROTEIN: 30 mg/dL — AB
Specific Gravity, Urine: 1.011 (ref 1.005–1.030)
pH: 6 (ref 5.0–8.0)

## 2017-02-13 LAB — I-STAT TROPONIN, ED
Troponin i, poc: 0.31 ng/mL (ref 0.00–0.08)
Troponin i, poc: 0.7 ng/mL (ref 0.00–0.08)

## 2017-02-13 LAB — PROTIME-INR
INR: 1.19
PROTHROMBIN TIME: 15.2 s (ref 11.4–15.2)

## 2017-02-13 LAB — LACTIC ACID, PLASMA
LACTIC ACID, VENOUS: 1.9 mmol/L (ref 0.5–1.9)
Lactic Acid, Venous: 1.5 mmol/L (ref 0.5–1.9)

## 2017-02-13 LAB — MRSA PCR SCREENING: MRSA by PCR: NEGATIVE

## 2017-02-13 LAB — I-STAT CG4 LACTIC ACID, ED
LACTIC ACID, VENOUS: 1.53 mmol/L (ref 0.5–1.9)
LACTIC ACID, VENOUS: 2.62 mmol/L — AB (ref 0.5–1.9)

## 2017-02-13 LAB — LIPASE, BLOOD: LIPASE: 39 U/L (ref 11–51)

## 2017-02-13 LAB — BRAIN NATRIURETIC PEPTIDE: B NATRIURETIC PEPTIDE 5: 259.5 pg/mL — AB (ref 0.0–100.0)

## 2017-02-13 LAB — HEPARIN LEVEL (UNFRACTIONATED): Heparin Unfractionated: 0.57 IU/mL (ref 0.30–0.70)

## 2017-02-13 LAB — TROPONIN I: Troponin I: 0.84 ng/mL (ref <0.03)

## 2017-02-13 LAB — MAGNESIUM
MAGNESIUM: 2.1 mg/dL (ref 1.7–2.4)
Magnesium: 1.9 mg/dL (ref 1.7–2.4)

## 2017-02-13 MED ORDER — SODIUM CHLORIDE 0.9 % IV BOLUS (SEPSIS)
1000.0000 mL | Freq: Once | INTRAVENOUS | Status: AC
  Administered 2017-02-13: 1000 mL via INTRAVENOUS

## 2017-02-13 MED ORDER — AMIODARONE HCL IN DEXTROSE 360-4.14 MG/200ML-% IV SOLN
60.0000 mg/h | INTRAVENOUS | Status: AC
  Administered 2017-02-13 (×2): 60 mg/h via INTRAVENOUS
  Filled 2017-02-13 (×3): qty 200

## 2017-02-13 MED ORDER — AMIODARONE LOAD VIA INFUSION
150.0000 mg | Freq: Once | INTRAVENOUS | Status: AC
  Administered 2017-02-13: 150 mg via INTRAVENOUS
  Filled 2017-02-13: qty 83.34

## 2017-02-13 MED ORDER — DILTIAZEM LOAD VIA INFUSION
10.0000 mg | Freq: Once | INTRAVENOUS | Status: AC
  Administered 2017-02-13: 10 mg via INTRAVENOUS
  Filled 2017-02-13 (×2): qty 10

## 2017-02-13 MED ORDER — ATORVASTATIN CALCIUM 80 MG PO TABS
80.0000 mg | ORAL_TABLET | Freq: Every day | ORAL | Status: DC
  Administered 2017-02-13 – 2017-02-15 (×3): 80 mg via ORAL
  Filled 2017-02-13 (×3): qty 1

## 2017-02-13 MED ORDER — VALACYCLOVIR HCL 500 MG PO TABS
500.0000 mg | ORAL_TABLET | Freq: Every day | ORAL | Status: DC
  Administered 2017-02-14 – 2017-02-16 (×3): 500 mg via ORAL
  Filled 2017-02-13 (×3): qty 1

## 2017-02-13 MED ORDER — AMIODARONE HCL IN DEXTROSE 360-4.14 MG/200ML-% IV SOLN
30.0000 mg/h | INTRAVENOUS | Status: DC
  Administered 2017-02-14 – 2017-02-15 (×2): 30 mg/h via INTRAVENOUS
  Filled 2017-02-13 (×2): qty 200

## 2017-02-13 MED ORDER — ONDANSETRON HCL 4 MG/2ML IJ SOLN: 4.0000 mg | Freq: Four times a day (QID) | INTRAMUSCULAR | Status: DC | PRN

## 2017-02-13 MED ORDER — HEPARIN (PORCINE) IN NACL 100-0.45 UNIT/ML-% IJ SOLN
1300.0000 [IU]/h | INTRAMUSCULAR | Status: DC
  Administered 2017-02-13 – 2017-02-15 (×3): 1300 [IU]/h via INTRAVENOUS
  Filled 2017-02-13 (×3): qty 250

## 2017-02-13 MED ORDER — SODIUM CHLORIDE 0.9 % IV SOLN: INTRAVENOUS | Status: DC

## 2017-02-13 MED ORDER — HEPARIN BOLUS VIA INFUSION
4000.0000 [IU] | Freq: Once | INTRAVENOUS | Status: AC
  Administered 2017-02-13: 4000 [IU] via INTRAVENOUS
  Filled 2017-02-13: qty 4000

## 2017-02-13 MED ORDER — EFAVIRENZ-EMTRICITAB-TENOFOVIR 600-200-300 MG PO TABS
1.0000 | ORAL_TABLET | Freq: Every day | ORAL | Status: DC
Start: 2017-02-14 — End: 2017-02-16
  Administered 2017-02-14 – 2017-02-16 (×3): 1 via ORAL
  Filled 2017-02-13 (×3): qty 1

## 2017-02-13 MED ORDER — POTASSIUM CHLORIDE CRYS ER 20 MEQ PO TBCR
40.0000 meq | EXTENDED_RELEASE_TABLET | Freq: Two times a day (BID) | ORAL | Status: DC
  Administered 2017-02-13: 40 meq via ORAL
  Filled 2017-02-13: qty 2

## 2017-02-13 MED ORDER — ACETAMINOPHEN 325 MG PO TABS
650.0000 mg | ORAL_TABLET | ORAL | Status: DC | PRN
  Administered 2017-02-15: 650 mg via ORAL
  Filled 2017-02-13: qty 2

## 2017-02-13 MED ORDER — ASPIRIN 81 MG PO CHEW
324.0000 mg | CHEWABLE_TABLET | Freq: Once | ORAL | Status: AC
  Administered 2017-02-13: 324 mg via ORAL
  Filled 2017-02-13: qty 4

## 2017-02-13 MED ORDER — ASPIRIN 325 MG PO TABS
325.0000 mg | ORAL_TABLET | Freq: Every day | ORAL | Status: DC
  Administered 2017-02-14 – 2017-02-16 (×2): 325 mg via ORAL
  Filled 2017-02-13 (×2): qty 1

## 2017-02-13 MED ORDER — POTASSIUM CHLORIDE CRYS ER 20 MEQ PO TBCR
40.0000 meq | EXTENDED_RELEASE_TABLET | Freq: Once | ORAL | Status: AC
  Administered 2017-02-13: 40 meq via ORAL
  Filled 2017-02-13: qty 2

## 2017-02-13 MED ORDER — DILTIAZEM HCL 100 MG IV SOLR
5.0000 mg/h | INTRAVENOUS | Status: DC
  Administered 2017-02-13: 5 mg/h via INTRAVENOUS
  Filled 2017-02-13: qty 100

## 2017-02-13 NOTE — Progress Notes (Signed)
ANTICOAGULATION CONSULT NOTE - Follow-up Consult  Pharmacy Consult for heparin Indication: chest pain/ACS  No Known Allergies  Patient Measurements: Height: 6' (182.9 cm) Weight: 249 lb 1.9 oz (113 kg) IBW/kg (Calculated) : 77.6 Heparin Dosing Weight: 100.6 kg  Vital Signs: Temp: 98.4 F (36.9 C) (02/25 1959) Temp Source: Oral (02/25 1959) BP: 103/78 (02/25 1959) Pulse Rate: 129 (02/25 1959)  Labs:  Recent Labs  02/13/17 1258 02/13/17 2041 02/13/17 2234  HGB 17.0  --   --   HCT 47.6  --   --   PLT 187  --   --   LABPROT 15.2  --   --   INR 1.19  --   --   HEPARINUNFRC  --   --  0.57  CREATININE 2.08*  --   --   TROPONINI  --  0.84*  --     Estimated Creatinine Clearance: 50.3 mL/min (by C-G formula based on SCr of 2.08 mg/dL (H)).  Assessment: 61 y.o. on heparin gtt for ACS. Heparin level therapeutic (0.57) on gtt at 1300 units/hr. No bleeding noted.  Goal of Therapy:  Heparin level 0.3-0.7 units/ml Monitor platelets by anticoagulation protocol: Yes   Plan:  -Continue heparin at 1300 units/hr -Daily HL, CBC  Sherlon Handing, PharmD, BCPS Clinical pharmacist, pager [phone] 02/13/2017,11:34 PM

## 2017-02-13 NOTE — ED Notes (Signed)
Pt given a half cup of cranberry juice per Dr. Sherry Ruffing.

## 2017-02-13 NOTE — ED Provider Notes (Signed)
[facility] DEPT Provider Note   CSN: [id number] Arrival date & time: 02/13/17  1201     History   Chief Complaint Chief Complaint  Patient presents with  . Atrial Flutter    pt with AFlutter initially had low BP for EMS prt arrives in no distress 20mg  IV cardizem given     HPI Michael Hubbard is a 61 y.o. male.  The history is provided by the patient and the spouse.  Emesis   This is a new problem. The current episode started 1 to 2 hours ago. The problem occurs 2 to 4 times per day. The problem has not changed since onset.The emesis has an appearance of stomach contents. There has been no fever. Associated symptoms include sweats. Pertinent negatives include no abdominal pain, no chills, no cough, no diarrhea, no fever, no headaches and no URI.  Dizziness  Quality:  Lightheadedness Severity:  Severe Onset quality:  Gradual Timing:  Constant Progression:  Unchanged Chronicity:  New Relieved by:  Nothing Worsened by:  Nothing Ineffective treatments:  None tried Associated symptoms: nausea and vomiting   Associated symptoms: no chest pain, no diarrhea, no headaches, no palpitations, no shortness of breath, no syncope and no vision changes     Past Medical History:  Diagnosis Date  . Atrial flutter ([place])   . Cancer ([place])   . HIV (human immunodeficiency virus infection) ([place])   . Hypertension   . Prostate cancer [facility])     Patient Active Problem List   Diagnosis Date Noted  . Neck muscle spasm 03/24/2015  . Greater trochanteric bursitis of right hip 03/24/2015    Past Surgical History:  Procedure Laterality Date  . BACK SURGERY    . CARPAL TUNNEL RELEASE         Home Medications    Prior to Admission medications   Medication Sig Start Date End Date Taking? Authorizing Provider  aspirin 325 MG tablet Take 325 mg by mouth daily.   Yes Historical Provider, MD  atorvastatin (LIPITOR) 80 MG tablet Take 80 mg by mouth daily. 03/07/16 03/07/17 Yes Historical  Provider, MD  ATRIPLA 600-200-300 MG per tablet Take 1 tablet by mouth daily. 02/25/15  Yes Historical Provider, MD  carvedilol (COREG) 6.25 MG tablet Take 6.25 mg by mouth 2 (two) times daily. 01/11/17  Yes Historical Provider, MD  furosemide (LASIX) 40 MG tablet Take 40 mg by mouth 2 (two) times daily. 02/03/17  Yes Historical Provider, MD  losartan (COZAAR) 50 MG tablet Take 50 mg by mouth daily.   Yes Historical Provider, MD  losartan-hydrochlorothiazide (HYZAAR) 100-12.5 MG per tablet Take 1 tablet by mouth daily. 02/16/15  Yes Historical Provider, MD  metoprolol succinate (TOPROL-XL) 50 MG 24 hr tablet Take 50 mg by mouth daily. 02/04/17 02/04/18 Yes Historical Provider, MD  oxyCODONE-acetaminophen (PERCOCET) 10-325 MG per tablet Take 1 tablet by mouth every 8 (eight) hours as needed for pain. for pain 03/15/15  Yes Historical Provider, MD  valACYclovir (VALTREX) 500 MG tablet Take 500 mg by mouth daily.  03/10/15  Yes Historical Provider, MD  predniSONE (DELTASONE) 50 MG tablet Take 1 tablet (50 mg total) by mouth daily. Patient not taking: Reported on 05/18/2015 03/24/15   Lyndal Pulley, DO    Family History No family history on file.  Social History Social History  Substance Use Topics  . Smoking status: Never Smoker  . Smokeless tobacco: Never Used  . Alcohol use Yes     Comment: occasionally  Allergies   Patient has no known allergies.   Review of Systems Review of Systems  Constitutional: Positive for diaphoresis and fatigue. Negative for appetite change, chills and fever.  HENT: Negative for congestion and rhinorrhea.   Respiratory: Negative for cough, chest tightness, shortness of breath, wheezing and stridor.   Cardiovascular: Negative for chest pain, palpitations, leg swelling and syncope.  Gastrointestinal: Positive for nausea and vomiting. Negative for abdominal pain and diarrhea.  Genitourinary: Negative for dysuria and flank pain.  Musculoskeletal: Negative for back  pain, neck pain and neck stiffness.  Skin: Negative for wound.  Neurological: Positive for light-headedness. Negative for syncope, numbness and headaches.  Psychiatric/Behavioral: Negative for agitation.  All other systems reviewed and are negative.    Physical Exam Updated Vital Signs BP 112/84 (BP Location: Right Arm)   Pulse (!) 132   Temp 97.9 F (36.6 C) (Oral)   Resp 24   Ht 6' (1.829 m)   Wt 241 lb (109.3 kg)   SpO2 97%   BMI 32.69 kg/m   Physical Exam  Constitutional: He is oriented to person, place, and time. He appears well-developed and well-nourished. No distress.  HENT:  Head: Normocephalic and atraumatic.  Right Ear: External ear normal.  Left Ear: External ear normal.  Nose: Nose normal.  Mouth/Throat: Oropharynx is clear and moist. No oropharyngeal exudate.  Eyes: Conjunctivae and EOM are normal. Pupils are equal, round, and reactive to light.  Neck: Normal range of motion. Neck supple.  Cardiovascular: Intact distal pulses.  An irregularly irregular rhythm present. Tachycardia present.   No murmur heard. Pulmonary/Chest: Effort normal and breath sounds normal. No stridor. No respiratory distress. He has no wheezes. He exhibits no tenderness.  Abdominal: Soft. There is no tenderness. There is no rebound and no guarding.  Musculoskeletal: He exhibits no edema or tenderness.  Neurological: He is alert and oriented to person, place, and time. He is not disoriented. No cranial nerve deficit or sensory deficit. He exhibits normal muscle tone. Coordination normal. GCS eye subscore is 4. GCS verbal subscore is 5. GCS motor subscore is 6.  Skin: Skin is warm. Capillary refill takes less than 2 seconds. No rash noted. He is not diaphoretic. No erythema. No pallor.     ED Treatments / Results  Labs (all labs ordered are listed, but only abnormal results are displayed) Labs Reviewed  CBC WITH DIFFERENTIAL/PLATELET - Abnormal; Notable for the following:       Result  Value   WBC 10.7 (*)    RBC 5.87 (*)    Neutro Abs 8.5 (*)    Monocytes Absolute 1.4 (*)    All other components within normal limits  COMPREHENSIVE METABOLIC PANEL - Abnormal; Notable for the following:    Potassium 2.8 (*)    Chloride 98 (*)    Glucose, Bld 130 (*)    BUN 22 (*)    Creatinine, Ser 2.08 (*)    Albumin 3.3 (*)    AST 42 (*)    Total Bilirubin 1.5 (*)    GFR calc non Af Amer 33 (*)    GFR calc Af Amer 39 (*)    All other components within normal limits  BRAIN NATRIURETIC PEPTIDE - Abnormal; Notable for the following:    B Natriuretic Peptide 259.5 (*)    All other components within normal limits  URINALYSIS, ROUTINE W REFLEX MICROSCOPIC - Abnormal; Notable for the following:    APPearance HAZY (*)    Hgb urine dipstick SMALL (*)  Protein, ur 30 (*)    Leukocytes, UA TRACE (*)    Squamous Epithelial / LPF 0-5 (*)    All other components within normal limits  TROPONIN I - Abnormal; Notable for the following:    Troponin I 0.84 (*)    All other components within normal limits  I-STAT TROPOININ, ED - Abnormal; Notable for the following:    Troponin i, poc 0.31 (*)    All other components within normal limits  I-STAT CG4 LACTIC ACID, ED - Abnormal; Notable for the following:    Lactic Acid, Venous 2.62 (*)    All other components within normal limits  I-STAT TROPOININ, ED - Abnormal; Notable for the following:    Troponin i, poc 0.70 (*)    All other components within normal limits  URINE CULTURE  MRSA PCR SCREENING  LIPASE, BLOOD  PROTIME-INR  MAGNESIUM  MAGNESIUM  LACTIC ACID, PLASMA  HEPARIN LEVEL (UNFRACTIONATED)  CBC  BASIC METABOLIC PANEL  LIPID PANEL  TROPONIN I  TROPONIN I  TSH  PHOSPHORUS  LACTIC ACID, PLASMA  I-STAT CG4 LACTIC ACID, ED    EKG  EKG Interpretation  Date/Time:  Sunday February 13 2017 12:03:56 EST Ventricular Rate:  128 PR Interval:    QRS Duration: 114 QT Interval:  368 QTC Calculation: 533 R Axis:   -50 Text  Interpretation:  Atrial flutter with varied AV block, Borderline IVCD with LAD Inferior infarct, age indeterminate Anterolateral infarct, age indeterminate Prolonged QT interval Aflutter with RVR, No STEMI Confirmed by Sherry Ruffing MD, Trinia Georgi [phone]) on 02/13/2017 4:58:44 PM       Radiology Dg Chest 2 View  Result Date: 02/13/2017 CLINICAL DATA:  SOB, near syncope, tachycardia, vomiting this morning. Hx of CHF, hypertension, atrial flutter, cancer. Nonsmoker. EXAM: CHEST  2 VIEW COMPARISON:  None. FINDINGS: The cardiac silhouette is mildly enlarged. No mediastinal or hilar masses. There is no evidence of adenopathy. Clear lungs.  No pleural effusion or pneumothorax. Skeletal structures are intact. IMPRESSION: No acute cardiopulmonary disease. Electronically Signed   By: Lajean Manes M.D.   On: 02/13/2017 13:46    Procedures Procedures (including critical care time)  CRITICAL CARE Performed by: Gwenyth Allegra Mariaeduarda Defranco Total critical care time: 45 minutes Critical care time was exclusive of separately billable procedures and treating other patients. Critical care was necessary to treat or prevent imminent or life-threatening deterioration. Critical care was time spent personally by me on the following activities: development of treatment plan with patient and/or surrogate as well as nursing, discussions with consultants, evaluation of patient's response to treatment, examination of patient, obtaining history from patient or surrogate, ordering and performing treatments and interventions, ordering and review of laboratory studies, ordering and review of radiographic studies, pulse oximetry and re-evaluation of patient's condition.   Medications Ordered in ED Medications  amiodarone (NEXTERONE) 1.8 mg/mL load via infusion 150 mg (150 mg Intravenous Bolus from Bag 02/13/17 1658)    Followed by  amiodarone (NEXTERONE PREMIX) 360-4.14 MG/200ML-% (1.8 mg/mL) IV infusion (60 mg/hr Intravenous New  Bag/Given 02/13/17 2049)    Followed by  amiodarone (NEXTERONE PREMIX) 360-4.14 MG/200ML-% (1.8 mg/mL) IV infusion (not administered)  heparin ADULT infusion 100 units/mL (25000 units/270mL sodium chloride 0.45%) (1,300 Units/hr Intravenous New Bag/Given 02/13/17 1854)  atorvastatin (LIPITOR) tablet 80 mg (not administered)  aspirin tablet 325 mg (not administered)  efavirenz-emtricitabine-tenofovir (ATRIPLA) 600-200-300 MG per tablet 1 tablet (not administered)  valACYclovir (VALTREX) tablet 500 mg (not administered)  acetaminophen (TYLENOL) tablet 650 mg (not administered)  ondansetron (ZOFRAN) injection 4 mg (not administered)  0.9 %  sodium chloride infusion (not administered)  potassium chloride SA (K-DUR,KLOR-CON) CR tablet 40 mEq (not administered)  sodium chloride 0.9 % bolus 1,000 mL (0 mLs Intravenous Stopped 02/13/17 1356)  diltiazem (CARDIZEM) 1 mg/mL load via infusion 10 mg (10 mg Intravenous Bolus from Bag 02/13/17 1340)  sodium chloride 0.9 % bolus 1,000 mL (0 mLs Intravenous Stopped 02/13/17 1629)  potassium chloride SA (K-DUR,KLOR-CON) CR tablet 40 mEq (40 mEq Oral Given 02/13/17 1451)  aspirin chewable tablet 324 mg (324 mg Oral Given 02/13/17 1634)  heparin bolus via infusion 4,000 Units (4,000 Units Intravenous Bolus from Bag 02/13/17 1857)     Initial Impression / Assessment and Plan / ED Course  I have reviewed the triage vital signs and the nursing notes.  Pertinent labs & imaging results that were available during my care of the patient were reviewed by me and considered in my medical decision making (see chart for details).    Michael Hubbard is a 61 y.o. male With a past medical history significant for hypertension, CHF, and documented history of atrial fibrillation who presents with nausea, vomiting, diaphoresis, and lightheadedness. Patient denied chest pain, palpitations, or infectious symptoms however, patient found to be in atrial flutter with RVR on  arrival.  History and exam are seen above.  Patient given aspirin. EKG shows atrial flutter with RVR. Patient started on diltiazem bolus and drip. Patient had decreased blood pressure and this was slowed and fluids were continued. Blood pressure improved.  Patient continued to be in atrial flutter. Troponin rising from 0.31 to 0.70. Lactic acid rising despite fluids. BNP slightly elevated.   Hypokalemia and acute kidney injury discovered. Potassium supplemented and fluids were given.  Cardiology called and they will see the patient. They recommended admission to internal medicine for further management. Hospitalist team called and they will be patient.  Initially, patient reluctant about admission however, given worsening laboratory testing, patient was convinced to stay.   Patient admitted for further management.    Final Clinical Impressions(s) / ED Diagnoses   Final diagnoses:  Atrial fibrillation with RVR (HCC)  Elevated troponin  Lactic acidosis     Clinical Impression: 1. Atrial fibrillation with RVR (HCC)   2. Elevated troponin   3. Lactic acidosis     Disposition: Admit to Hospitalist service    Courtney Paris, MD 02/13/17 2221

## 2017-02-13 NOTE — Consult Note (Addendum)
Primary cardiologist: [facility] Consulting cardiologist: Dr Carlyle Dolly MD Requesting physician: Dr Tegeler Indication aflutter  Clinical Summary Mr. Michael Hubbard is a 61 y.o.male with history of chronic systolic HF LVEF [id number], prostate cancer, HIV admitted with nausea and vomiting. In ER found to be in aflutter with RVR, cardiology was consulted  His cardiac history is difficult to put together, he has seen multiple providers over the last few years. He started seeing a Dr Frankey Poot at [facility] in [place] he states August 2017 and was diagnosed with heart failure at that time. He states a cath was never done, only thing he remembers is talking about a defibrillator. He later was seen by [facility] cardiology during a recent admission, and most recently has been following with [facility] cardiology. Echo 01/2017 in Care everywhere reports LVEF <25%. He thinks he may have had aflutter before, but does not remember being on any meds or anticoagulation. He denies any chest pain, no SOB. Has had some nausea, vomiting, fatigue, and lightheadedness.    Echo [facility] 01/2017: LVEF < 25%, abnormal diastolic function, moderate MR, mild to mod TR, severe LAE WBC 10.7 Hgb 17 Plt 187 K 2.8 Cr 2.08 (1.3 1 year ago) lipase 39 BNP 259 INR 1.19  Mg 2.1 Lactic acid 2.62 Trop 0.31-->0.70  CXR no acute process EKG aflutter with RVR  No Known Allergies  Medications Scheduled Medications:   Infusions: . diltiazem (CARDIZEM) infusion 10 mg/hr (02/13/17 1452)     PRN Medications:     Past Medical History:  Diagnosis Date  . Atrial flutter ([place])   . Cancer ([place])   . HIV (human immunodeficiency virus infection) ([place])   . Hypertension   . Prostate cancer [facility])     Past Surgical History:  Procedure Laterality Date  . BACK SURGERY    . CARPAL TUNNEL RELEASE      No family history on file.  Social History Mr. Letsch reports that he has never smoked. He has never used smokeless tobacco. Mr. Shank reports that  he drinks alcohol.  Review of Systems CONSTITUTIONAL: No weight loss, fever, chills, weakness or fatigue.  HEENT: Eyes: No visual loss, blurred vision, double vision or yellow sclerae. No hearing loss, sneezing, congestion, runny nose or sore throat.  SKIN: No rash or itching.  CARDIOVASCULAR: No chest pain, chest pressure or chest discomfort. No palpitations or edema.  RESPIRATORY: No shortness of breath, cough or sputum.  GASTROINTESTINAL: per HPI GENITOURINARY: no polyuria, no dysuria NEUROLOGICAL: No headache, dizziness, syncope, paralysis, ataxia, numbness or tingling in the extremities. No change in bowel or bladder control.  MUSCULOSKELETAL: No muscle, back pain, joint pain or stiffness.  HEMATOLOGIC: No anemia, bleeding or bruising.  LYMPHATICS: No enlarged nodes. No history of splenectomy.  PSYCHIATRIC: No history of depression or anxiety.      Physical Examination Blood pressure 106/88, pulse 65, temperature 97.9 F (36.6 C), temperature source Oral, resp. rate 23, height 6' (1.829 m), weight 241 lb (109.3 kg), SpO2 97 %.  Intake/Output Summary (Last 24 hours) at 02/13/17 1641 Last data filed at 02/13/17 1629  Gross per 24 hour  Intake             1000 ml  Output                0 ml  Net             1000 ml    HEENT: sclera clear, throat clear  Cardiovascular: regular, rate 130, no  m/r/g, no jvd  Respiratory: CTAB  GI: abdomen soft, NT, ND  MSK: no LE edema  Neuro: no focal deficits  Psych: appropriate affect   Lab Results  Basic Metabolic Panel:  Recent Labs Lab 02/13/17 1258  NA 139  K 2.8*  CL 98*  CO2 31  GLUCOSE 130*  BUN 22*  CREATININE 2.08*  CALCIUM 9.1  MG 2.1    Liver Function Tests:  Recent Labs Lab 02/13/17 1258  AST 42*  ALT 28  ALKPHOS 94  BILITOT 1.5*  PROT 6.9  ALBUMIN 3.3*    CBC:  Recent Labs Lab 02/13/17 1258  WBC 10.7*  NEUTROABS 8.5*  HGB 17.0  HCT 47.6  MCV 81.1  PLT 187    Cardiac Enzymes: No  results for input(s): CKTOTAL, CKMB, CKMBINDEX, TROPONINI in the last 168 hours.  BNP: Invalid input(s): POCBNP   ECG   Imaging 02/03/17 Echo  Interpretation Summary A complete two-dimensional transthoracic echocardiogram with color flow Doppler and spectral Doppler was performed. The left ventricle is severely dilated. There is normal left ventricular wall thickness. There is severe diffuse hypokinesis of the left ventricle. The left ventricular ejection fraction is severely reduced (<25%). The left ventricular diastolic function is abnormal. The left atrium is severely dilated. The mitral valve is normal in structure with moderate [2+] regurgitation. The tricuspid valve leaflets are structurally normal with mild to moderate (1-2+) regurgitation. There is mild aortic valve thickening with trace regurgitation.  Left Ventricle The left ventricle is severely dilated. There is normal left ventricular wall thickness. There is severe diffuse hypokinesis of the left ventricle.The left ventricular ejection fraction is severely reduced (<25%). The left ventricular diastolic function is abnormal.   Right Ventricle The right ventricle is grossly normal in size and function.  Atria The left atrium is severely dilated. The right atrium is normal.  Mitral Valve The mitral valve is normal in structure with moderate [2+] regurgitation.   Tricuspid Valve The tricuspid valve leaflets are structurally normal with mild to moderate (1-2+) regurgitation.Right ventricular systolic pressure is elevated between 30-23mm Hg, consistent with mild pulmonary hypertension.  Aortic Valve There is mild aortic valve thickening with trace regurgitation.  Pulmonic Valve The pulmonic valve is normal in structure and function.  Vessels The aortic root is normal. The pulmonary artery is not well visualized.  Pericardium There is no pericardial effusion.    Impression/Recommendations 1. Atrial  flutter - unclear duration, he is asymptomatic - dilt had been started in ER, became hypotensive. Given that response and low LVEF diilt has been stopped, start amio gtt - CHADS2Vasc score is 2, starting heparin gtt due to elevated troponins and will need long term anticoag  2. Chronic systolic HF - history somewhat unclear due to multiple providers and limited records available - LVEF <25% by echo 01/2017. Does not appear he has had an ischemic evaluation - with soft bp's would hold bp/CHF meds overnight. LIkely resume in AM.   3. Elevated troponin - unclear if demand ischemia due to aflutter and RVR and hypotension, or CAD - follow troponin trend. Pending renal function may consider ischemic testing this admission.  - heparin IV has been started. COntinue ASA   4. Lactic acidosis - per primary team  5. Hypokalemia - likely related to vomiting, replacement per primary team  6. AKI - likely prerenal from N/V - gentle IVFs.   7. HIV - on atripla  8. Prostate cancer - followed at [facility].. From note scans in 02/2016 without  metastatic disease. Undergoing PSA surveillance.    Carlyle Dolly, M.D.

## 2017-02-13 NOTE — ED Triage Notes (Signed)
Pt with elevated HR comes in with no CP no distress pt felt nauseas and became diaphoretic

## 2017-02-13 NOTE — ED Notes (Signed)
Report attempted 

## 2017-02-13 NOTE — Progress Notes (Signed)
ANTICOAGULATION CONSULT NOTE - Initial Consult  Pharmacy Consult for heparin Indication: chest pain/ACS  No Known Allergies  Patient Measurements: Height: 6' (182.9 cm) Weight: 241 lb (109.3 kg) IBW/kg (Calculated) : 77.6 Heparin Dosing Weight: 100.6 kg  Vital Signs: Temp: 97.9 F (36.6 C) (02/25 1210) Temp Source: Oral (02/25 1210) BP: 108/81 (02/25 1645) Pulse Rate: 66 (02/25 1645)  Labs:  Recent Labs  02/13/17 1258  HGB 17.0  HCT 47.6  PLT 187  LABPROT 15.2  INR 1.19  CREATININE 2.08*    Estimated Creatinine Clearance: 49.4 mL/min (by C-G formula based on SCr of 2.08 mg/dL (H)).   Medical History: Past Medical History:  Diagnosis Date  . Atrial flutter ([place])   . Cancer ([place])   . HIV (human immunodeficiency virus infection) ([place])   . Hypertension   . Prostate cancer ([place])      Assessment: Heparin gtt for ACS Trop + ECHO showing EF < 25% CBC stable No anticoagulants prior to admission   Goal of Therapy:  Heparin level 0.3-0.7 units/ml Monitor platelets by anticoagulation protocol: Yes     Plan:  -Heparin bolus 4000 units x1 then 1300 units/hr -Daily HL, CBC -Level tonight   Michael Hubbard 02/13/2017,4:53 PM

## 2017-02-13 NOTE — ED Notes (Signed)
PT's BP dropped. This nurse reports to bedside. BP confirmed, Dr. Sherry Ruffing made aware.

## 2017-02-13 NOTE — ED Notes (Signed)
Patient transported to X-ray 

## 2017-02-13 NOTE — H&P (Signed)
[facility] History and Physical  Michael Hubbard [id number] DOB: 1956/12/18 DOA: 02/13/2017  PCP: No primary care provider on file.  Patient coming from: Home  Chief Complaint: Lightheadedness  HPI: Michael Hubbard is a 61 y.o. male with a medical history of atrial flutter, CHF, prostate cancer, hypertension, HIV, who presented to the emergency department with complaints of feeling lightheaded. Patient also experienced one episode of nausea and vomiting, as well as diaphoresis. Currently denies any complaints of chest pain. Does endorse having a history of atrial flutter but only on aspirin therapy. Patient does see a cardiologist at [facility] as well as one in [facility]. He last had an echocardiogram 02/03/2017. Currently patient denies any further nausea, shortness of breath, abdominal pain, dizziness, headache, recent illness. 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   ED Course: Found to have atrial flutter with RVR. Started on Cardizem drip. Became hypotensive and was given fluid bolus. Troponin also elevated at 0.31, second troponin 0.70. Cardiology was consulted. TRH called for admission.  Review of Systems:  All other systems reviewed and are negative.   Past Medical History:  Diagnosis Date  . Atrial flutter ([place])   . Cancer ([place])   . HIV (human immunodeficiency virus infection) ([place])   . Hypertension   . Prostate cancer [facility])     Past Surgical History:  Procedure Laterality Date  . BACK SURGERY    . CARPAL TUNNEL RELEASE      Social History:  reports that he has never smoked. He has never used smokeless tobacco. He reports that he drinks alcohol. He reports that he does not use drugs.  No Known Allergies  Family History  Problem Relation Age of Onset  . CAD Father   . Diabetes Brother      Prior to Admission medications   Medication Sig Start Date  End Date Taking? Authorizing Provider  aspirin 325 MG tablet Take 325 mg by mouth daily.   Yes Historical Provider, MD  atorvastatin (LIPITOR) 80 MG tablet Take 80 mg by mouth daily. 03/07/16 03/07/17 Yes Historical Provider, MD  ATRIPLA 600-200-300 MG per tablet Take 1 tablet by mouth daily. 02/25/15  Yes Historical Provider, MD  carvedilol (COREG) 6.25 MG tablet Take 6.25 mg by mouth 2 (two) times daily. 01/11/17  Yes Historical Provider, MD  furosemide (LASIX) 40 MG tablet Take 40 mg by mouth 2 (two) times daily. 02/03/17  Yes Historical Provider, MD  losartan (COZAAR) 50 MG tablet Take 50 mg by mouth daily.   Yes Historical Provider, MD  losartan-hydrochlorothiazide (HYZAAR) 100-12.5 MG per tablet Take 1 tablet by mouth daily. 02/16/15  Yes Historical Provider, MD  metoprolol succinate (TOPROL-XL) 50 MG 24 hr tablet Take 50 mg by mouth daily. 02/04/17 02/04/18 Yes Historical Provider, MD  oxyCODONE-acetaminophen (PERCOCET) 10-325 MG per tablet Take 1 tablet by mouth every 8 (eight) hours as needed for pain. for pain 03/15/15  Yes Historical Provider, MD  valACYclovir (VALTREX) 500 MG tablet Take 500 mg by mouth daily.  03/10/15  Yes Historical Provider, MD    Physical Exam: Vitals:   02/13/17 1715 02/13/17 1730  BP: 106/76 118/84  Pulse: (!) 59 62  Resp: 20 17  Temp:       General: Well developed, well nourished, NAD, appears stated age  HEENT: NCAT, PERRLA, EOMI, Anicteic Sclera, mucous membranes moist.   Neck: Supple, no JVD,  no masses  Cardiovascular: S1 S2 auscultated, irregularly irregular  Respiratory: Clear to auscultation bilaterally with equal chest rise  Abdomen: Soft, obese, nontender, nondistended, + bowel sounds  Extremities: warm dry without cyanosis clubbing or edema  Neuro: AAOx3, cranial nerves grossly intact. Strength 5/5 in patient's upper and lower extremities bilaterally  Skin: Without rashes exudates or nodules  Psych: Normal affect and demeanor with intact  judgement and insight  Labs on Admission: I have personally reviewed following labs and imaging studies CBC:  Recent Labs Lab 02/13/17 1258  WBC 10.7*  NEUTROABS 8.5*  HGB 17.0  HCT 47.6  MCV 81.1  PLT 123XX123   Basic Metabolic Panel:  Recent Labs Lab 02/13/17 1258  NA 139  K 2.8*  CL 98*  CO2 31  GLUCOSE 130*  BUN 22*  CREATININE 2.08*  CALCIUM 9.1  MG 2.1   GFR: Estimated Creatinine Clearance: 49.4 mL/min (by C-G formula based on SCr of 2.08 mg/dL (H)). Liver Function Tests:  Recent Labs Lab 02/13/17 1258  AST 42*  ALT 28  ALKPHOS 94  BILITOT 1.5*  PROT 6.9  ALBUMIN 3.3*    Recent Labs Lab 02/13/17 1258  LIPASE 39   No results for input(s): AMMONIA in the last 168 hours. Coagulation Profile:  Recent Labs Lab 02/13/17 1258  INR 1.19   Cardiac Enzymes: No results for input(s): CKTOTAL, CKMB, CKMBINDEX, TROPONINI in the last 168 hours. BNP (last 3 results) No results for input(s): PROBNP in the last 8760 hours. HbA1C: No results for input(s): HGBA1C in the last 72 hours. CBG: No results for input(s): GLUCAP in the last 168 hours. Lipid Profile: No results for input(s): CHOL, HDL, LDLCALC, TRIG, CHOLHDL, LDLDIRECT in the last 72 hours. Thyroid Function Tests: No results for input(s): TSH, T4TOTAL, FREET4, T3FREE, THYROIDAB in the last 72 hours. Anemia Panel: No results for input(s): VITAMINB12, FOLATE, FERRITIN, TIBC, IRON, RETICCTPCT in the last 72 hours. Urine analysis:    Component Value Date/Time   COLORURINE YELLOW 02/13/2017 1307   APPEARANCEUR HAZY (A) 02/13/2017 1307   LABSPEC 1.011 02/13/2017 1307   PHURINE 6.0 02/13/2017 1307   GLUCOSEU NEGATIVE 02/13/2017 1307   HGBUR SMALL (A) 02/13/2017 1307   BILIRUBINUR NEGATIVE 02/13/2017 1307   KETONESUR NEGATIVE 02/13/2017 1307   PROTEINUR 30 (A) 02/13/2017 1307   UROBILINOGEN 0.2 05/18/2015 1128   NITRITE NEGATIVE 02/13/2017 1307   LEUKOCYTESUR TRACE (A) 02/13/2017 1307   Sepsis  Labs: @LABRCNTIP (procalcitonin:4,lacticidven:4) )No results found for this or any previous visit (from the past 240 hour(s)).   Radiological Exams on Admission: Dg Chest 2 View  Result Date: 02/13/2017 CLINICAL DATA:  SOB, near syncope, tachycardia, vomiting this morning. Hx of CHF, hypertension, atrial flutter, cancer. Nonsmoker. EXAM: CHEST  2 VIEW COMPARISON:  None. FINDINGS: The cardiac silhouette is mildly enlarged. No mediastinal or hilar masses. There is no evidence of adenopathy. Clear lungs.  No pleural effusion or pneumothorax. Skeletal structures are intact. IMPRESSION: No acute cardiopulmonary disease. Electronically Signed   By: Lajean Manes M.D.   On: 02/13/2017 13:46    EKG: Independently reviewed. Atrial flutter, RVR, rate 128  Assessment/Plan  Atrial flutter with RVR -Unknown cause, patient states this has occurred in the past, but was only placed on aspirin -Was started on cardizem drip in the ER, however became hypotensive -Cardiology consulted and appreciated -will place on amiodarone and heparin drips -Will obtain TSH, Magnesium, phosphate levels -potassium low, will replace and monitor BMP -CHADSVASC 2  Elevated troponin -Possibly related  to the above, demand ischemia -Patient denies any chest pain at this time. -Continue to cycle troponins -Will be placed on heparin drip  Acute kidney injury on chronic kidney disease, stage II -Baseline creatinine approximately 1.5, upon admission 2.08 -Patient is on multiple ARB's as well as diuretics. -Will place on gentle IV fluids and continue to monitor BMP  Transient hypotension -Likely secondary to Cardizem, patient's blood pressure did drop to 89/72 however rebounded with IV fluids -Continue to monitor  Elevated lactic acid -Lactic acid 2.62, likely secondary to the above along with dehydration as patient did have episode of nausea with vomiting prior to coming to the emergency department. -Placed on gentle IV  fluid and retained monitor lactic acid level  Chronic systolic heart failure -Patient states he sees a cardiologist in [place] and withdrawn as well as a [employer] -Last echocardiogram February 03, 2017 showed an EF of 25% -Will hold Lasix at this time as well as ARB given his acute kidney injury and hypotension -Monitor intake and output, daily weights -Patient currently appears to be euvolemic  Hypokalemia -Likely secondary to diuresis and possible GI losses -Obtain magnesium level and replace potassium -Monitor BMP  HIV -Continue Atripla -patient follows with a physician in [place], [place]  Hyperlipidemia -Continue statin, check lipid panel  Essential hypertension -Given AK I and hypotension, will hold Coreg, losartan, Hyzaar, metoprolol, Lasix  History prostate cancer -Stable, follows with physician at [facility]. Has PSA monitored   DVT prophylaxis: heparin  Code Status: Full  Family Communication: Friends at bedside. Admission, patients condition and plan of care including tests being ordered have been discussed with the patient and friends who indicate understanding and agree with the plan and Code Status.  Disposition Plan: home when stable and HR controlled  Consults called: Cardiology   Admission status: observation   Time spent: 65 minutes  Michael Hubbard D.O. [facility] Pager [phone]  If 7PM-7AM, please contact night-coverage [email or web address] Password [facility] 02/13/2017, 5:38 PM

## 2017-02-13 NOTE — ED Notes (Addendum)
Additional liter of saline started at this time. PT A&O x4. No symptomatic response to BP drop

## 2017-02-13 NOTE — ED Notes (Signed)
Pt ambulated to bathroom without difficulty.

## 2017-02-14 DIAGNOSIS — Z8546 Personal history of malignant neoplasm of prostate: Secondary | ICD-10-CM | POA: Diagnosis not present

## 2017-02-14 DIAGNOSIS — Z79899 Other long term (current) drug therapy: Secondary | ICD-10-CM | POA: Diagnosis not present

## 2017-02-14 DIAGNOSIS — I248 Other forms of acute ischemic heart disease: Secondary | ICD-10-CM | POA: Diagnosis present

## 2017-02-14 DIAGNOSIS — I5042 Chronic combined systolic (congestive) and diastolic (congestive) heart failure: Secondary | ICD-10-CM | POA: Diagnosis present

## 2017-02-14 DIAGNOSIS — R42 Dizziness and giddiness: Secondary | ICD-10-CM | POA: Diagnosis present

## 2017-02-14 DIAGNOSIS — I429 Cardiomyopathy, unspecified: Secondary | ICD-10-CM

## 2017-02-14 DIAGNOSIS — Z21 Asymptomatic human immunodeficiency virus [HIV] infection status: Secondary | ICD-10-CM | POA: Diagnosis present

## 2017-02-14 DIAGNOSIS — I4891 Unspecified atrial fibrillation: Secondary | ICD-10-CM | POA: Diagnosis present

## 2017-02-14 DIAGNOSIS — E876 Hypokalemia: Secondary | ICD-10-CM | POA: Diagnosis not present

## 2017-02-14 DIAGNOSIS — E872 Acidosis, unspecified: Secondary | ICD-10-CM | POA: Diagnosis present

## 2017-02-14 DIAGNOSIS — I5022 Chronic systolic (congestive) heart failure: Secondary | ICD-10-CM | POA: Diagnosis not present

## 2017-02-14 DIAGNOSIS — T502X5A Adverse effect of carbonic-anhydrase inhibitors, benzothiadiazides and other diuretics, initial encounter: Secondary | ICD-10-CM | POA: Diagnosis not present

## 2017-02-14 DIAGNOSIS — I272 Pulmonary hypertension, unspecified: Secondary | ICD-10-CM | POA: Diagnosis present

## 2017-02-14 DIAGNOSIS — E785 Hyperlipidemia, unspecified: Secondary | ICD-10-CM | POA: Diagnosis present

## 2017-02-14 DIAGNOSIS — R748 Abnormal levels of other serum enzymes: Secondary | ICD-10-CM | POA: Diagnosis not present

## 2017-02-14 DIAGNOSIS — I484 Atypical atrial flutter: Secondary | ICD-10-CM | POA: Diagnosis present

## 2017-02-14 DIAGNOSIS — I4892 Unspecified atrial flutter: Secondary | ICD-10-CM | POA: Diagnosis not present

## 2017-02-14 DIAGNOSIS — N182 Chronic kidney disease, stage 2 (mild): Secondary | ICD-10-CM | POA: Diagnosis present

## 2017-02-14 DIAGNOSIS — N179 Acute kidney failure, unspecified: Secondary | ICD-10-CM

## 2017-02-14 DIAGNOSIS — I959 Hypotension, unspecified: Secondary | ICD-10-CM | POA: Diagnosis present

## 2017-02-14 DIAGNOSIS — I2510 Atherosclerotic heart disease of native coronary artery without angina pectoris: Secondary | ICD-10-CM | POA: Diagnosis present

## 2017-02-14 DIAGNOSIS — I420 Dilated cardiomyopathy: Secondary | ICD-10-CM | POA: Diagnosis present

## 2017-02-14 DIAGNOSIS — Z7982 Long term (current) use of aspirin: Secondary | ICD-10-CM | POA: Diagnosis not present

## 2017-02-14 DIAGNOSIS — E860 Dehydration: Secondary | ICD-10-CM | POA: Diagnosis present

## 2017-02-14 DIAGNOSIS — I130 Hypertensive heart and chronic kidney disease with heart failure and stage 1 through stage 4 chronic kidney disease, or unspecified chronic kidney disease: Secondary | ICD-10-CM | POA: Diagnosis present

## 2017-02-14 DIAGNOSIS — I10 Essential (primary) hypertension: Secondary | ICD-10-CM | POA: Diagnosis not present

## 2017-02-14 LAB — BASIC METABOLIC PANEL
Anion gap: 9 (ref 5–15)
BUN: 21 mg/dL — AB (ref 6–20)
CALCIUM: 8.7 mg/dL — AB (ref 8.9–10.3)
CO2: 28 mmol/L (ref 22–32)
CREATININE: 1.92 mg/dL — AB (ref 0.61–1.24)
Chloride: 100 mmol/L — ABNORMAL LOW (ref 101–111)
GFR calc Af Amer: 43 mL/min — ABNORMAL LOW (ref >60)
GFR calc non Af Amer: 37 mL/min — ABNORMAL LOW (ref >60)
GLUCOSE: 116 mg/dL — AB (ref 65–99)
Potassium: 3 mmol/L — ABNORMAL LOW (ref 3.5–5.1)
Sodium: 137 mmol/L (ref 135–145)

## 2017-02-14 LAB — CBC
HEMATOCRIT: 43.1 % (ref 39.0–52.0)
HEMOGLOBIN: 15.3 g/dL (ref 13.0–17.0)
MCH: 28.7 pg (ref 26.0–34.0)
MCHC: 35.5 g/dL (ref 30.0–36.0)
MCV: 80.7 fL (ref 78.0–100.0)
Platelets: 179 10*3/uL (ref 150–400)
RBC: 5.34 MIL/uL (ref 4.22–5.81)
RDW: 15 % (ref 11.5–15.5)
WBC: 9.3 10*3/uL (ref 4.0–10.5)

## 2017-02-14 LAB — HEPARIN LEVEL (UNFRACTIONATED): Heparin Unfractionated: 0.51 IU/mL (ref 0.30–0.70)

## 2017-02-14 LAB — LIPID PANEL
Cholesterol: 113 mg/dL (ref 0–200)
HDL: 39 mg/dL — AB (ref >40)
LDL CALC: 63 mg/dL (ref 0–99)
Total CHOL/HDL Ratio: 2.9 RATIO
Triglycerides: 56 mg/dL (ref <150)
VLDL: 11 mg/dL (ref 0–40)

## 2017-02-14 LAB — URINE CULTURE: CULTURE: NO GROWTH

## 2017-02-14 LAB — TSH: TSH: 0.498 u[IU]/mL (ref 0.350–4.500)

## 2017-02-14 LAB — TROPONIN I
TROPONIN I: 0.58 ng/mL — AB (ref <0.03)
Troponin I: 0.39 ng/mL (ref <0.03)

## 2017-02-14 LAB — PHOSPHORUS: Phosphorus: 2.8 mg/dL (ref 2.5–4.6)

## 2017-02-14 MED ORDER — POTASSIUM CHLORIDE CRYS ER 20 MEQ PO TBCR: 40.0000 meq | EXTENDED_RELEASE_TABLET | Freq: Once | ORAL | Status: DC

## 2017-02-14 MED ORDER — SODIUM CHLORIDE 0.9 % IV SOLN
30.0000 meq | Freq: Once | INTRAVENOUS | Status: AC
  Administered 2017-02-14: 30 meq via INTRAVENOUS
  Filled 2017-02-14: qty 15

## 2017-02-14 NOTE — Progress Notes (Signed)
PROGRESS NOTE    Michael Hubbard  [id number] DOB: 05-Jul-1956 DOA: 02/13/2017 PCP: No primary care provider on file.   Chief Complaint  Patient presents with  . Atrial Flutter    pt with AFlutter initially had low BP for EMS prt arrives in no distress 20mg  IV cardizem given      Brief Narrative:  HPI on 02/13/2017  Michael Hubbard is a 61 y.o. male with a medical history of atrial flutter, CHF, prostate cancer, hypertension, HIV, who presented to the emergency department with complaints of feeling lightheaded. Patient also experienced one episode of nausea and vomiting, as well as diaphoresis. Currently denies any complaints of chest pain. Does endorse having a history of atrial flutter but only on aspirin therapy. Patient does see a cardiologist at [facility] as well as one in [facility]. He last had an echocardiogram 02/03/2017. Currently patient denies any further nausea, shortness of breath, abdominal pain, dizziness, headache, recent illness. 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  Assessment & Plan   Atrial flutter with RVR -Unknown cause, patient states this has occurred in the past, but was only placed on aspirin -Was started on cardizem drip in the ER, however became hypotensive -Cardiology consulted and appreciated, possible cath when creatinine improves  -Continue amiodarone and heparin drips -Patient now in NSR -TSH 0.498, Magnesium 2.1, phos 2.8 (WNL) -Continue to supplement potassium -CHADSVASC 2 -Given HIV, oral AC would have to be pradaxa  Elevated troponin -Possibly related to the above, demand ischemia -Patient denies any chest pain at this time. -Trops cycled, peaked at 0.84, trending downward to 0.39 -Continue heparin drip  Acute kidney injury on chronic kidney disease, stage II -Baseline creatinine approximately 1.5, upon admission 2.08 -Currently  1.92 -Patient is on multiple ARB's as well as diuretics. -Continue gentle IV fluids and continue to monitor BMP  Transient hypotension -Likely secondary to Cardizem, patient's blood pressure did drop to 89/72 however rebounded with IV fluids -Continue to monitor  Elevated lactic acid -Lactic acid 2.62, likely secondary to the above along with dehydration as patient did have episode of nausea with vomiting prior to coming to the emergency department. -Resolved, lactic acid 1.5  Chronic systolic heart failure -Patient states he sees a cardiologist in [place] and withdrawn as well as a [employer] -Last echocardiogram February 03, 2017 showed an EF of 25% -Will hold Lasix at this time as well as ARB given his acute kidney injury and hypotension -Monitor intake and output, daily weights -Patient currently appears to be euvolemic  Hypokalemia -Likely secondary to diuresis and possible GI losses -Continue to supplement and monitor BMP  HIV -Continue Atripla -patient follows with a physician in [place], [place]  Hyperlipidemia -Continue statin -Lipid panel: TC 113, HDL 39, LDL 63, TG 56  Essential hypertension -Given AK I and hypotension, will hold Coreg, losartan, Hyzaar, metoprolol, Lasix  History prostate cancer -Stable, follows with physician at [facility]. Has PSA monitored   DVT Prophylaxis  Heparin   Code Status: Full  Family Communication: Friend at bedside  Disposition Plan: Observation, pending catheterization when creatinine improves  Consultants Cardiology  Procedures  None  Antibiotics   Anti-infectives    Start     Dose/Rate Route Frequency Ordered Stop   02/14/17 1000  efavirenz-emtricitabine-tenofovir (ATRIPLA) [id number] MG per tablet 1 tablet     1 tablet Oral Daily 02/13/17 2009  02/14/17 1000  valACYclovir (VALTREX) tablet 500 mg     500 mg Oral Daily 02/13/17 2009        Subjective:   Michael Hubbard seen and examined today. Denies  chest pain, shortness of breath, abdominal pain, nausea, vomiting, diarrhea, constipation, dizziness, lightheadedness. Wants to eat.  Objective:   Vitals:   02/14/17 0700 02/14/17 0800 02/14/17 0804 02/14/17 1310  BP:   (!) 125/96 106/75  Pulse:   93 93  Resp: 13 (!) 26 18 (!) 22  Temp:   97.9 F (36.6 C) 97.8 F (36.6 C)  TempSrc:   Oral Oral  SpO2:   99% 96%  Weight:      Height:        Intake/Output Summary (Last 24 hours) at 02/14/17 1336 Last data filed at 02/14/17 1100  Gross per 24 hour  Intake          2878.39 ml  Output              202 ml  Net          2676.39 ml   Filed Weights   02/13/17 1204 02/13/17 1959 02/14/17 0326  Weight: 109.3 kg (241 lb) 113 kg (249 lb 1.9 oz) 111.7 kg (246 lb 4.8 oz)    Exam  General: Well developed, well nourished, NAD, appears stated age  56: NCAT, mucous membranes moist.   Cardiovascular: S1 S2 auscultated, RRR, no murmurs  Respiratory: Clear to auscultation bilaterally with equal chest rise  Abdomen: Soft, nontender, nondistended, + bowel sounds  Extremities: warm dry without cyanosis clubbing or edema  Neuro: AAOx3, nonfocal  Psych: Normal affect and demeanor with intact judgement and insight   Data Reviewed: I have personally reviewed following labs and imaging studies  CBC:  Recent Labs Lab 02/13/17 1258 02/14/17 0136  WBC 10.7* 9.3  NEUTROABS 8.5*  --   HGB 17.0 15.3  HCT 47.6 43.1  MCV 81.1 80.7  PLT 187 0000000   Basic Metabolic Panel:  Recent Labs Lab 02/13/17 1258 02/13/17 2041 02/14/17 0136 02/14/17 0900  NA 139  --  137  --   K 2.8*  --  3.0*  --   CL 98*  --  100*  --   CO2 31  --  28  --   GLUCOSE 130*  --  116*  --   BUN 22*  --  21*  --   CREATININE 2.08*  --  1.92*  --   CALCIUM 9.1  --  8.7*  --   MG 2.1 1.9  --   --   PHOS  --   --   --  2.8   GFR: Estimated Creatinine Clearance: 54.1 mL/min (by C-G formula based on SCr of 1.92 mg/dL (H)). Liver Function Tests:  Recent  Labs Lab 02/13/17 1258  AST 42*  ALT 28  ALKPHOS 94  BILITOT 1.5*  PROT 6.9  ALBUMIN 3.3*    Recent Labs Lab 02/13/17 1258  LIPASE 39   No results for input(s): AMMONIA in the last 168 hours. Coagulation Profile:  Recent Labs Lab 02/13/17 1258  INR 1.19   Cardiac Enzymes:  Recent Labs Lab 02/13/17 2041 02/14/17 0136 02/14/17 0900  TROPONINI 0.84* 0.58* 0.39*   BNP (last 3 results) No results for input(s): PROBNP in the last 8760 hours. HbA1C: No results for input(s): HGBA1C in the last 72 hours. CBG: No results for input(s): GLUCAP in the last 168 hours. Lipid Profile:  Recent Labs  02/14/17 0136  CHOL 113  HDL 39*  LDLCALC 63  TRIG 56  CHOLHDL 2.9   Thyroid Function Tests:  Recent Labs  02/14/17 0136  TSH 0.498   Anemia Panel: No results for input(s): VITAMINB12, FOLATE, FERRITIN, TIBC, IRON, RETICCTPCT in the last 72 hours. Urine analysis:    Component Value Date/Time   COLORURINE YELLOW 02/13/2017 1307   APPEARANCEUR HAZY (A) 02/13/2017 1307   LABSPEC 1.011 02/13/2017 1307   PHURINE 6.0 02/13/2017 1307   GLUCOSEU NEGATIVE 02/13/2017 1307   HGBUR SMALL (A) 02/13/2017 1307   BILIRUBINUR NEGATIVE 02/13/2017 1307   KETONESUR NEGATIVE 02/13/2017 1307   PROTEINUR 30 (A) 02/13/2017 1307   UROBILINOGEN 0.2 05/18/2015 1128   NITRITE NEGATIVE 02/13/2017 1307   LEUKOCYTESUR TRACE (A) 02/13/2017 1307   Sepsis Labs: @LABRCNTIP (procalcitonin:4,lacticidven:4)  ) Recent Results (from the past 240 hour(s))  Urine culture     Status: None   Collection Time: 02/13/17  1:07 PM  Result Value Ref Range Status   Specimen Description URINE, RANDOM  Final   Special Requests NONE  Final   Culture NO GROWTH  Final   Report Status 02/14/2017 FINAL  Final  MRSA PCR Screening     Status: None   Collection Time: 02/13/17  8:09 PM  Result Value Ref Range Status   MRSA by PCR NEGATIVE NEGATIVE Final    Comment:        The GeneXpert MRSA Assay  (FDA approved for NASAL specimens only), is one component of a comprehensive MRSA colonization surveillance program. It is not intended to diagnose MRSA infection nor to guide or monitor treatment for MRSA infections.       Radiology Studies: Dg Chest 2 View  Result Date: 02/13/2017 CLINICAL DATA:  SOB, near syncope, tachycardia, vomiting this morning. Hx of CHF, hypertension, atrial flutter, cancer. Nonsmoker. EXAM: CHEST  2 VIEW COMPARISON:  None. FINDINGS: The cardiac silhouette is mildly enlarged. No mediastinal or hilar masses. There is no evidence of adenopathy. Clear lungs.  No pleural effusion or pneumothorax. Skeletal structures are intact. IMPRESSION: No acute cardiopulmonary disease. Electronically Signed   By: Lajean Manes M.D.   On: 02/13/2017 13:46     Scheduled Meds: . aspirin  325 mg Oral Daily  . atorvastatin  80 mg Oral QHS  . efavirenz-emtricitabine-tenofovir  1 tablet Oral Daily  . valACYclovir  500 mg Oral Daily   Continuous Infusions: . sodium chloride 75 mL/hr at 02/14/17 1100  . amiodarone 30 mg/hr (02/14/17 1100)  . heparin 1,300 Units/hr (02/14/17 1203)     LOS: 0 days   Time Spent in minutes   30 minutes  Shiah Berhow D.O. on 02/14/2017 at 1:36 PM  Between 7am to 7pm - Pager - [phone]  After 7pm go to [email or web address] - password [email or web address]  And look for the night coverage person covering for me after hours  [facility] Office  [phone]

## 2017-02-14 NOTE — Progress Notes (Signed)
Progress Note  Patient Name: Michael Hubbard Date of Encounter: 02/14/2017  Primary Cardiologist: @[facility]   Subjective   No chest pain.   No prior cardiac cath.  Followed at [facility] after losing belief in the care he received in [place] for cardiac care and prostate cancer, for several reasons.  Inpatient Medications    Scheduled Meds: . aspirin  325 mg Oral Daily  . atorvastatin  80 mg Oral QHS  . efavirenz-emtricitabine-tenofovir  1 tablet Oral Daily  . potassium chloride (KCL MULTIRUN) 30 mEq in 265 mL IVPB  30 mEq Intravenous Once  . valACYclovir  500 mg Oral Daily   Continuous Infusions: . sodium chloride 75 mL/hr at 02/13/17 2015  . amiodarone 30 mg/hr (02/14/17 0634)  . heparin 1,300 Units/hr (02/14/17 0500)   PRN Meds: acetaminophen, ondansetron (ZOFRAN) IV   Vital Signs    Vitals:   02/14/17 0326 02/14/17 0400 02/14/17 0500 02/14/17 0804  BP: 106/86   (!) 125/96  Pulse: (!) 125   93  Resp: 19 (!) 21 16 18   Temp: 98.7 F (37.1 C)   97.9 F (36.6 C)  TempSrc:    Oral  SpO2: 100%   99%  Weight: 246 lb 4.8 oz (111.7 kg)     Height:        Intake/Output Summary (Last 24 hours) at 02/14/17 1124 Last data filed at 02/14/17 0800  Gross per 24 hour  Intake          1429.14 ml  Output              202 ml  Net          1227.14 ml   Filed Weights   02/13/17 1204 02/13/17 1959 02/14/17 0326  Weight: 241 lb (109.3 kg) 249 lb 1.9 oz (113 kg) 246 lb 4.8 oz (111.7 kg)    Telemetry    NSR - Personally Reviewed  ECG    Initially atrial flutter - Personally Reviewed  Physical Exam   GEN: No acute distress.   Neck: No JVD Cardiac: RRR, no murmurs, rubs, or gallops.  Respiratory: Clear to auscultation bilaterally. GI: Soft, nontender, non-distended  MS: No edema; No deformity. Neuro:  Nonfocal  Psych: Normal affect   Labs    Chemistry Recent Labs Lab 02/13/17 1258 02/14/17 0136  NA 139 137  K 2.8* 3.0*  CL 98* 100*  CO2 31 28  GLUCOSE 130* 116*    BUN 22* 21*  CREATININE 2.08* 1.92*  CALCIUM 9.1 8.7*  PROT 6.9  --   ALBUMIN 3.3*  --   AST 42*  --   ALT 28  --   ALKPHOS 94  --   BILITOT 1.5*  --   GFRNONAA 33* 37*  GFRAA 39* 43*  ANIONGAP 10 9     Hematology Recent Labs Lab 02/13/17 1258 02/14/17 0136  WBC 10.7* 9.3  RBC 5.87* 5.34  HGB 17.0 15.3  HCT 47.6 43.1  MCV 81.1 80.7  MCH 29.0 28.7  MCHC 35.7 35.5  RDW 14.9 15.0  PLT 187 179    Cardiac Enzymes Recent Labs Lab 02/13/17 2041 02/14/17 0136 02/14/17 0900  TROPONINI 0.84* 0.58* 0.39*    Recent Labs Lab 02/13/17 1316 02/13/17 1553  TROPIPOC 0.31* 0.70*     BNP Recent Labs Lab 02/13/17 1258  BNP 259.5*     DDimer No results for input(s): DDIMER in the last 168 hours.   Radiology    Dg Chest 2 View  Result Date: 02/13/2017 CLINICAL DATA:  SOB, near syncope, tachycardia, vomiting this morning. Hx of CHF, hypertension, atrial flutter, cancer. Nonsmoker. EXAM: CHEST  2 VIEW COMPARISON:  None. FINDINGS: The cardiac silhouette is mildly enlarged. No mediastinal or hilar masses. There is no evidence of adenopathy. Clear lungs.  No pleural effusion or pneumothorax. Skeletal structures are intact. IMPRESSION: No acute cardiopulmonary disease. Electronically Signed   By: Lajean Manes M.D.   On: 02/13/2017 13:46    Cardiac Studies   EF <25%  Patient Profile     61 y.o. male with cardiomyopathy, atrial flutter, elevated troponin  Assessment & Plan    Elevated troponin:  COntinue IV heparin. Plan for cath when Cr improves.  Gentle hydration at this time.  Diet ordered.  NPO p MN tonight.  Continue medical therapy for CHF.  No ACE-I due to renal insufficiency.  Atrial flutter.  ON IV Amio.  COntinue for now.  Look to stop amio tomorrow. May need longterm anticoagulation givn LV dysfunction.  Signed, Larae Grooms, MD  02/14/2017, 11:24 AM

## 2017-02-14 NOTE — Progress Notes (Signed)
ANTICOAGULATION CONSULT NOTE - Follow-up Consult  Pharmacy Consult for heparin Indication: chest pain/ACS  No Known Allergies  Patient Measurements: Height: 6' (182.9 cm) Weight: 246 lb 4.8 oz (111.7 kg) IBW/kg (Calculated) : 77.6 Heparin Dosing Weight: 100.6 kg  Vital Signs: Temp: 97.9 F (36.6 C) (02/26 0804) Temp Source: Oral (02/26 0804) BP: 125/96 (02/26 0804) Pulse Rate: 93 (02/26 0804)  Labs:  Recent Labs  02/13/17 1258 02/13/17 2041 02/13/17 2234 02/14/17 0136 02/14/17 0900  HGB 17.0  --   --  15.3  --   HCT 47.6  --   --  43.1  --   PLT 187  --   --  179  --   LABPROT 15.2  --   --   --   --   INR 1.19  --   --   --   --   HEPARINUNFRC  --   --  0.57 0.51  --   CREATININE 2.08*  --   --  1.92*  --   TROPONINI  --  0.84*  --  0.58* 0.39*    Estimated Creatinine Clearance: 54.1 mL/min (by C-G formula based on SCr of 1.92 mg/dL (H)).  Assessment: 61 y.o. on heparin gtt for ACS. Heparin level remains therapeutic on gtt at 1300 units/hr. No bleeding noted.  Ultimately plan cardiac cath when renal function improved.  Goal of Therapy:  Heparin level 0.3-0.7 units/ml Monitor platelets by anticoagulation protocol: Yes   Plan:  -Continue heparin at 1300 units/hr -Daily heparin level and CBC  Kannon Granderson, Pharm.D., BCPS Clinical Pharmacist Pager [phone] 02/14/2017 1:03 PM

## 2017-02-15 ENCOUNTER — Encounter (HOSPITAL_COMMUNITY): Payer: Self-pay | Admitting: Cardiology

## 2017-02-15 ENCOUNTER — Encounter (HOSPITAL_COMMUNITY)
Admission: EM | Disposition: A | Discharge status: Home / Self Care | Payer: Self-pay | Source: Home / Self Care | Attending: Internal Medicine

## 2017-02-15 DIAGNOSIS — N182 Chronic kidney disease, stage 2 (mild): Secondary | ICD-10-CM

## 2017-02-15 DIAGNOSIS — I5042 Chronic combined systolic (congestive) and diastolic (congestive) heart failure: Secondary | ICD-10-CM

## 2017-02-15 DIAGNOSIS — I4891 Unspecified atrial fibrillation: Secondary | ICD-10-CM

## 2017-02-15 HISTORY — PX: LEFT HEART CATH AND CORONARY ANGIOGRAPHY: CATH118249

## 2017-02-15 LAB — BASIC METABOLIC PANEL
Anion gap: 8 (ref 5–15)
BUN: 19 mg/dL (ref 6–20)
CALCIUM: 9.1 mg/dL (ref 8.9–10.3)
CO2: 26 mmol/L (ref 22–32)
Chloride: 104 mmol/L (ref 101–111)
Creatinine, Ser: 1.55 mg/dL — ABNORMAL HIGH (ref 0.61–1.24)
GFR calc non Af Amer: 48 mL/min — ABNORMAL LOW (ref >60)
GFR, EST AFRICAN AMERICAN: 55 mL/min — AB (ref >60)
GLUCOSE: 109 mg/dL — AB (ref 65–99)
POTASSIUM: 3.7 mmol/L (ref 3.5–5.1)
Sodium: 138 mmol/L (ref 135–145)

## 2017-02-15 LAB — CBC
HCT: 41.4 % (ref 39.0–52.0)
Hemoglobin: 14.7 g/dL (ref 13.0–17.0)
MCH: 28.8 pg (ref 26.0–34.0)
MCHC: 35.5 g/dL (ref 30.0–36.0)
MCV: 81.2 fL (ref 78.0–100.0)
PLATELETS: 174 10*3/uL (ref 150–400)
RBC: 5.1 MIL/uL (ref 4.22–5.81)
RDW: 14.9 % (ref 11.5–15.5)
WBC: 6.4 10*3/uL (ref 4.0–10.5)

## 2017-02-15 LAB — HEPARIN LEVEL (UNFRACTIONATED): Heparin Unfractionated: 0.43 IU/mL (ref 0.30–0.70)

## 2017-02-15 SURGERY — LEFT HEART CATH AND CORONARY ANGIOGRAPHY

## 2017-02-15 MED ORDER — HEPARIN (PORCINE) IN NACL 2-0.9 UNIT/ML-% IJ SOLN
INTRAMUSCULAR | Status: DC | PRN
  Administered 2017-02-15: 1000 mL

## 2017-02-15 MED ORDER — HEPARIN SODIUM (PORCINE) 1000 UNIT/ML IJ SOLN
INTRAMUSCULAR | Status: AC
  Filled 2017-02-15: qty 1

## 2017-02-15 MED ORDER — LIDOCAINE HCL (PF) 1 % IJ SOLN
INTRAMUSCULAR | Status: DC | PRN
  Administered 2017-02-15: 2 mL via SUBCUTANEOUS

## 2017-02-15 MED ORDER — VERAPAMIL HCL 2.5 MG/ML IV SOLN
INTRAVENOUS | Status: DC | PRN
  Administered 2017-02-15: 10 mL via INTRA_ARTERIAL

## 2017-02-15 MED ORDER — MIDAZOLAM HCL 2 MG/2ML IJ SOLN
INTRAMUSCULAR | Status: AC
  Filled 2017-02-15: qty 2

## 2017-02-15 MED ORDER — FUROSEMIDE 10 MG/ML IJ SOLN
40.0000 mg | Freq: Once | INTRAMUSCULAR | Status: AC
  Administered 2017-02-15: 40 mg via INTRAVENOUS
  Filled 2017-02-15: qty 4

## 2017-02-15 MED ORDER — FENTANYL CITRATE (PF) 100 MCG/2ML IJ SOLN
INTRAMUSCULAR | Status: AC
  Filled 2017-02-15: qty 2

## 2017-02-15 MED ORDER — FENTANYL CITRATE (PF) 100 MCG/2ML IJ SOLN
INTRAMUSCULAR | Status: DC | PRN
  Administered 2017-02-15: 25 ug via INTRAVENOUS

## 2017-02-15 MED ORDER — HEPARIN (PORCINE) IN NACL 100-0.45 UNIT/ML-% IJ SOLN: 1300.0000 [IU]/h | INTRAMUSCULAR | Status: DC

## 2017-02-15 MED ORDER — IOPAMIDOL (ISOVUE-370) INJECTION 76%
INTRAVENOUS | Status: AC
  Filled 2017-02-15: qty 100

## 2017-02-15 MED ORDER — LIDOCAINE HCL (PF) 1 % IJ SOLN
INTRAMUSCULAR | Status: AC
  Filled 2017-02-15: qty 30

## 2017-02-15 MED ORDER — MIDAZOLAM HCL 2 MG/2ML IJ SOLN
INTRAMUSCULAR | Status: DC | PRN
  Administered 2017-02-15: 1 mg via INTRAVENOUS

## 2017-02-15 MED ORDER — IOPAMIDOL (ISOVUE-370) INJECTION 76%
INTRAVENOUS | Status: DC | PRN
  Administered 2017-02-15: 90 mL via INTRA_ARTERIAL

## 2017-02-15 MED ORDER — AMIODARONE HCL 200 MG PO TABS
200.0000 mg | ORAL_TABLET | Freq: Two times a day (BID) | ORAL | Status: DC
  Administered 2017-02-15 – 2017-02-16 (×2): 200 mg via ORAL
  Filled 2017-02-15 (×2): qty 1

## 2017-02-15 MED ORDER — HEPARIN SODIUM (PORCINE) 1000 UNIT/ML IJ SOLN
INTRAMUSCULAR | Status: DC | PRN
  Administered 2017-02-15: 5000 [IU] via INTRAVENOUS

## 2017-02-15 MED ORDER — SODIUM CHLORIDE 0.9% FLUSH
3.0000 mL | Freq: Two times a day (BID) | INTRAVENOUS | Status: DC
  Administered 2017-02-15 – 2017-02-16 (×2): 3 mL via INTRAVENOUS

## 2017-02-15 MED ORDER — SODIUM CHLORIDE 0.9 % IV SOLN: INTRAVENOUS | Status: AC

## 2017-02-15 MED ORDER — SODIUM CHLORIDE 0.9% FLUSH
3.0000 mL | INTRAVENOUS | Status: DC | PRN
  Administered 2017-02-15: 3 mL via INTRAVENOUS
  Filled 2017-02-15: qty 3

## 2017-02-15 MED ORDER — POTASSIUM CHLORIDE CRYS ER 20 MEQ PO TBCR
40.0000 meq | EXTENDED_RELEASE_TABLET | Freq: Once | ORAL | Status: AC
  Administered 2017-02-15: 40 meq via ORAL
  Filled 2017-02-15: qty 2

## 2017-02-15 MED ORDER — HEPARIN (PORCINE) IN NACL 2-0.9 UNIT/ML-% IJ SOLN
INTRAMUSCULAR | Status: AC
  Filled 2017-02-15: qty 1000

## 2017-02-15 MED ORDER — SODIUM CHLORIDE 0.9 % IV SOLN: 250.0000 mL | INTRAVENOUS | Status: DC | PRN

## 2017-02-15 MED ORDER — VERAPAMIL HCL 2.5 MG/ML IV SOLN
INTRAVENOUS | Status: AC
  Filled 2017-02-15: qty 2

## 2017-02-15 SURGICAL SUPPLY — 10 items
CATH INFINITI 5FR ANG PIGTAIL (CATHETERS) ×2 IMPLANT
DEVICE RAD COMP TR BAND LRG (VASCULAR PRODUCTS) ×2 IMPLANT
GLIDESHEATH SLEND A-KIT 6F 22G (SHEATH) ×2 IMPLANT
GUIDEWIRE INQWIRE 1.5J.035X260 (WIRE) ×1 IMPLANT
INQWIRE 1.5J .035X260CM (WIRE) ×2
KIT HEART LEFT (KITS) ×2 IMPLANT
PACK CARDIAC CATHETERIZATION (CUSTOM PROCEDURE TRAY) ×2 IMPLANT
SYR MEDRAD MARK V 150ML (SYRINGE) ×2 IMPLANT
TRANSDUCER W/STOPCOCK (MISCELLANEOUS) ×2 IMPLANT
TUBING CIL FLEX 10 FLL-RA (TUBING) ×2 IMPLANT

## 2017-02-15 NOTE — Brief Op Note (Signed)
   NAME:  Michael Hubbard   MRN: [id number] DOB:  [birth year]   ADMIT DATE: 02/13/2017  Brief CardiaC Catheterization Note:  Primary Cardiologist: '@[facility]  - referred by Dr. Casandra Doffing Referring Physician: Dr. Ree Kida  Indication: 1. Elevated Troponin 2. Atrial flutter with rapid ventricular response 3. 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 Left Heart Catheterization with Native Coroanry  Angiography via Right Artery access  Access: 6 French Right Radial Glide Sheath using micropuncture Angiocath Kit  Left Heart Catheterization:  5 French TRAP 4.0 catheter advanced and exchanged over a safety J-wire. Used for left and right coronary angiography  5 French Angled Pigtail Catheter used for Left Ventricular Hemodynamics and Hand Injection LV Gram, then to measure Aortic Valve pullback gradient  Catheter then removed with the body over wire without competition  Radial sheath removed with TR band applied at 0945 hrs., 14 mL air  No complications. EBL<20 mL  Medications:  1 mg Versed IV; 25 mcg Fentanyl IV; sedation time 26 minutes with direct observation.  Radial cocktail: 3 mL verapamil in 10 mL NS  Contrast: 90 mL (extremely large arteries)  Impression: A geographic Lee normal coronary arteries = Nonischemic Cardiomyopathy  Codominant system with moderate large-caliber RCA giving rise to very diminutive PDA.   Very large wraparound LAD that perfuses the distal two thirds of the PDA to the apex. 2 moderate caliber diagonal branches (1 very proximal and one mid)   Large-caliber ramus intermedius coursing as a OM branching into 2 moderate caliber branches.  The circumflex courses in the AV groove and terminates with 2 small branches perfusing the left posterolateral system.  Severely reduced LV function with EF of roughly 15-20% with global hypokinesis  LVEDP: 30-36 mmHg  Recommendations:  Return to nursing unit for  continued care with TR band removal.  He will need gentle hydration after contrast, but will be started back on IV Lasix. I will give him 1 dose today and reassess in the morning.  Continued heart failure management for nonischemic cardiomyopathy  Full note to follow   Glenetta Hew, M.D., M.S. Interventional Cardiologist   Pager # [phone] Phone # [phone] [address] [place], [place] [address]  02/15/2017 9:46 AM

## 2017-02-15 NOTE — Progress Notes (Signed)
PROGRESS NOTE    Michael Hubbard  [id number] DOB: 1956-11-11 DOA: 02/13/2017 PCP: No primary care provider on file.   Chief Complaint  Patient presents with  . Atrial Flutter    pt with AFlutter initially had low BP for EMS prt arrives in no distress 20mg  IV cardizem given      Brief Narrative:  HPI on 02/13/2017  Michael Hubbard is a 61 y.o. male with a medical history of atrial flutter, CHF, prostate cancer, hypertension, HIV, who presented to the emergency department with complaints of feeling lightheaded. Patient also experienced one episode of nausea and vomiting, as well as diaphoresis. Currently denies any complaints of chest pain. Does endorse having a history of atrial flutter but only on aspirin therapy. Patient does see a cardiologist at [facility] as well as one in [facility]. He last had an echocardiogram 02/03/2017. Currently patient denies any further nausea, shortness of breath, abdominal pain, dizziness, headache, recent illness. 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   Interim history S/p Cath today, medical management. EF 15-20%. Pending further recommendations from cardiology  Assessment & Plan   Atrial flutter with RVR -Unknown cause, patient states this has occurred in the past, but was only placed on aspirin -Was started on cardizem drip in the ER, however became hypotensive -Cardiology consulted and appreciated, possible cath when creatinine improves  -Was placed on amiodarone and heparin drips. Both discontinued. -Patient now in NSR -TSH 0.498, Magnesium 2.1, phos 2.8 (WNL) -Continue to supplement potassium -CHADSVASC 2 -Given HIV, oral AC would have to be pradaxa -S/p cardiac catheterization, recommended medical management, EF 15-20% with global hypokinesis.   Elevated troponin -Possibly related to the above, demand ischemia -Patient denies any  chest pain at this time. -Trops cycled, peaked at 0.84, trending downward to 0.39 -heparin drip held for cardiac cath  Acute kidney injury on chronic kidney disease, stage II -Baseline creatinine approximately 1.5, upon admission 2.08 -Currently 1.55 -Patient is on multiple ARB's as well as diuretics. -Continue gentle IV fluids and continue to monitor BMP  Transient hypotension -Likely secondary to Cardizem, patient's blood pressure did drop to 89/72 however rebounded with IV fluids -Continue to monitor  Elevated lactic acid -Lactic acid 2.62, likely secondary to the above along with dehydration as patient did have episode of nausea with vomiting prior to coming to the emergency department. -Resolved, lactic acid 1.5  Chronic systolic heart failure -Patient states he sees a cardiologist in [place] and withdrawn as well as a [employer] -Last echocardiogram February 03, 2017 showed an EF of <25% -Will hold Lasix at this time as well as ARB given his acute kidney injury and hypotension -Monitor intake and output, daily weights -Patient currently appears to be euvolemic -Given one dose of lasix after catheterization today  Hypokalemia -Currently 3.7 -Likely secondary to diuresis and possible GI losses -Continue to supplement and monitor BMP  HIV -Continue Atripla -patient follows with a physician in [place], [place]  Hyperlipidemia -Continue statin -Lipid panel: TC 113, HDL 39, LDL 63, TG 56  Essential hypertension -Given AKI and hypotension, will hold Coreg, losartan, Hyzaar, metoprolol, Lasix   History prostate cancer -Stable, follows with physician at [facility]. Has PSA monitored   DVT Prophylaxis  Heparin   Code Status: Full  Family Communication: None at bedside  Disposition Plan: Admitted. Pending further recommendations from cardiology  Consultants Cardiology  Procedures  Cardiac catheterization  Antibiotics   Anti-infectives    Start      Dose/Rate Route Frequency Ordered Stop   02/14/17 1000  efavirenz-emtricitabine-tenofovir (ATRIPLA) [id number] MG per tablet 1 tablet     1 tablet Oral Daily 02/13/17 2009     02/14/17 1000  valACYclovir (VALTREX) tablet 500 mg     500 mg Oral Daily 02/13/17 2009        Subjective:   Michael Hubbard seen and examined today. Denies chest pain, shortness of breath, abdominal pain, nausea, vomiting, diarrhea, constipation, dizziness, lightheadedness. Wants to eat and wants to know what comes next.   Objective:   Vitals:   02/15/17 0934 02/15/17 0939 02/15/17 0942 02/15/17 0947  BP: 121/87 113/83 115/84 117/86  Pulse: 87 85 84 (!) 0  Resp: 20 (!) 23 (!) 21 (!) 0  Temp:      TempSrc:      SpO2: 99% 99% 99% (!) 0%  Weight:      Height:        Intake/Output Summary (Last 24 hours) at 02/15/17 1115 Last data filed at 02/15/17 1000  Gross per 24 hour  Intake              768 ml  Output              200 ml  Net              568 ml   Filed Weights   02/13/17 1959 02/14/17 0326 02/15/17 0300  Weight: 113 kg (249 lb 1.9 oz) 111.7 kg (246 lb 4.8 oz) 114.5 kg (252 lb 8 oz)    Exam  General: Well developed, well nourished, NAD  HEENT: NCAT, mucous membranes moist.   Cardiovascular: S1 S2 auscultated, RRR, no murmurs  Respiratory: Clear to auscultation bilaterally with equal chest rise  Abdomen: Soft, nontender, nondistended, + bowel sounds  Extremities: warm dry without cyanosis clubbing or edema  Neuro: AAOx3, nonfocal  Psych: Normal affect and demeanor   Data Reviewed: I have personally reviewed following labs and imaging studies  CBC:  Recent Labs Lab 02/13/17 1258 02/14/17 0136 02/15/17 0409  WBC 10.7* 9.3 6.4  NEUTROABS 8.5*  --   --   HGB 17.0 15.3 14.7  HCT 47.6 43.1 41.4  MCV 81.1 80.7 81.2  PLT 187 179 [id number]   Basic Metabolic Panel:  Recent Labs Lab 02/13/17 1258 02/13/17 2041 02/14/17 0136 02/14/17 0900 02/15/17 0409  NA 139  --  137  --  138  K  2.8*  --  3.0*  --  3.7  CL 98*  --  100*  --  104  CO2 31  --  28  --  26  GLUCOSE 130*  --  116*  --  109*  BUN 22*  --  21*  --  19  CREATININE 2.08*  --  1.92*  --  1.55*  CALCIUM 9.1  --  8.7*  --  9.1  MG 2.1 1.9  --   --   --   PHOS  --   --   --  2.8  --    GFR: Estimated Creatinine Clearance: 67.9 mL/min (by C-G formula based on SCr of 1.55 mg/dL (H)). Liver Function Tests:  Recent Labs Lab 02/13/17 1258  AST 42*  ALT 28  ALKPHOS 94  BILITOT 1.5*  PROT 6.9  ALBUMIN 3.3*    Recent Labs Lab 02/13/17 1258  LIPASE 39   No results for input(s):  AMMONIA in the last 168 hours. Coagulation Profile:  Recent Labs Lab 02/13/17 1258  INR 1.19   Cardiac Enzymes:  Recent Labs Lab 02/13/17 2041 02/14/17 0136 02/14/17 0900  TROPONINI 0.84* 0.58* 0.39*   BNP (last 3 results) No results for input(s): PROBNP in the last 8760 hours. HbA1C: No results for input(s): HGBA1C in the last 72 hours. CBG: No results for input(s): GLUCAP in the last 168 hours. Lipid Profile:  Recent Labs  02/14/17 0136  CHOL 113  HDL 39*  LDLCALC 63  TRIG 56  CHOLHDL 2.9   Thyroid Function Tests:  Recent Labs  02/14/17 0136  TSH 0.498   Anemia Panel: No results for input(s): VITAMINB12, FOLATE, FERRITIN, TIBC, IRON, RETICCTPCT in the last 72 hours. Urine analysis:    Component Value Date/Time   COLORURINE YELLOW 02/13/2017 1307   APPEARANCEUR HAZY (A) 02/13/2017 1307   LABSPEC 1.011 02/13/2017 1307   PHURINE 6.0 02/13/2017 1307   GLUCOSEU NEGATIVE 02/13/2017 1307   HGBUR SMALL (A) 02/13/2017 1307   BILIRUBINUR NEGATIVE 02/13/2017 1307   KETONESUR NEGATIVE 02/13/2017 1307   PROTEINUR 30 (A) 02/13/2017 1307   UROBILINOGEN 0.2 05/18/2015 1128   NITRITE NEGATIVE 02/13/2017 1307   LEUKOCYTESUR TRACE (A) 02/13/2017 1307   Sepsis Labs: @LABRCNTIP (procalcitonin:4,lacticidven:4)  ) Recent Results (from the past 240 hour(s))  Urine culture     Status: None   Collection  Time: 02/13/17  1:07 PM  Result Value Ref Range Status   Specimen Description URINE, RANDOM  Final   Special Requests NONE  Final   Culture NO GROWTH  Final   Report Status 02/14/2017 FINAL  Final  MRSA PCR Screening     Status: None   Collection Time: 02/13/17  8:09 PM  Result Value Ref Range Status   MRSA by PCR NEGATIVE NEGATIVE Final    Comment:        The GeneXpert MRSA Assay (FDA approved for NASAL specimens only), is one component of a comprehensive MRSA colonization surveillance program. It is not intended to diagnose MRSA infection nor to guide or monitor treatment for MRSA infections.       Radiology Studies: Dg Chest 2 View  Result Date: 02/13/2017 CLINICAL DATA:  SOB, near syncope, tachycardia, vomiting this morning. Hx of CHF, hypertension, atrial flutter, cancer. Nonsmoker. EXAM: CHEST  2 VIEW COMPARISON:  None. FINDINGS: The cardiac silhouette is mildly enlarged. No mediastinal or hilar masses. There is no evidence of adenopathy. Clear lungs.  No pleural effusion or pneumothorax. Skeletal structures are intact. IMPRESSION: No acute cardiopulmonary disease. Electronically Signed   By: Lajean Manes M.D.   On: 02/13/2017 13:46     Scheduled Meds: . aspirin  325 mg Oral Daily  . atorvastatin  80 mg Oral QHS  . efavirenz-emtricitabine-tenofovir  1 tablet Oral Daily  . sodium chloride flush  3 mL Intravenous Q12H  . valACYclovir  500 mg Oral Daily   Continuous Infusions: . sodium chloride 75 mL/hr at 02/14/17 1700  . sodium chloride       LOS: 1 day   Time Spent in minutes   30 minutes  Aaden Buckman D.O. on 02/15/2017 at 11:15 AM  Between 7am to 7pm - Pager - [phone]  After 7pm go to [email or web address] - password [email or web address]  And look for the night coverage person covering for me after hours  [facility] Office  [phone]

## 2017-02-15 NOTE — H&P (View-Only) (Signed)
Progress Note  Patient Name: Michael Hubbard Date of Encounter: 02/14/2017  Primary Cardiologist: @[facility]   Subjective   No chest pain.   No prior cardiac cath.  Followed at [facility] after losing belief in the care he received in [place] for cardiac care and prostate cancer, for several reasons.  Inpatient Medications    Scheduled Meds: . aspirin  325 mg Oral Daily  . atorvastatin  80 mg Oral QHS  . efavirenz-emtricitabine-tenofovir  1 tablet Oral Daily  . potassium chloride (KCL MULTIRUN) 30 mEq in 265 mL IVPB  30 mEq Intravenous Once  . valACYclovir  500 mg Oral Daily   Continuous Infusions: . sodium chloride 75 mL/hr at 02/13/17 2015  . amiodarone 30 mg/hr (02/14/17 0634)  . heparin 1,300 Units/hr (02/14/17 0500)   PRN Meds: acetaminophen, ondansetron (ZOFRAN) IV   Vital Signs    Vitals:   02/14/17 0326 02/14/17 0400 02/14/17 0500 02/14/17 0804  BP: 106/86   (!) 125/96  Pulse: (!) 125   93  Resp: 19 (!) 21 16 18   Temp: 98.7 F (37.1 C)   97.9 F (36.6 C)  TempSrc:    Oral  SpO2: 100%   99%  Weight: 246 lb 4.8 oz (111.7 kg)     Height:        Intake/Output Summary (Last 24 hours) at 02/14/17 1124 Last data filed at 02/14/17 0800  Gross per 24 hour  Intake          1429.14 ml  Output              202 ml  Net          1227.14 ml   Filed Weights   02/13/17 1204 02/13/17 1959 02/14/17 0326  Weight: 241 lb (109.3 kg) 249 lb 1.9 oz (113 kg) 246 lb 4.8 oz (111.7 kg)    Telemetry    NSR - Personally Reviewed  ECG    Initially atrial flutter - Personally Reviewed  Physical Exam   GEN: No acute distress.   Neck: No JVD Cardiac: RRR, no murmurs, rubs, or gallops.  Respiratory: Clear to auscultation bilaterally. GI: Soft, nontender, non-distended  MS: No edema; No deformity. Neuro:  Nonfocal  Psych: Normal affect   Labs    Chemistry Recent Labs Lab 02/13/17 1258 02/14/17 0136  NA 139 137  K 2.8* 3.0*  CL 98* 100*  CO2 31 28  GLUCOSE 130* 116*    BUN 22* 21*  CREATININE 2.08* 1.92*  CALCIUM 9.1 8.7*  PROT 6.9  --   ALBUMIN 3.3*  --   AST 42*  --   ALT 28  --   ALKPHOS 94  --   BILITOT 1.5*  --   GFRNONAA 33* 37*  GFRAA 39* 43*  ANIONGAP 10 9     Hematology Recent Labs Lab 02/13/17 1258 02/14/17 0136  WBC 10.7* 9.3  RBC 5.87* 5.34  HGB 17.0 15.3  HCT 47.6 43.1  MCV 81.1 80.7  MCH 29.0 28.7  MCHC 35.7 35.5  RDW 14.9 15.0  PLT 187 179    Cardiac Enzymes Recent Labs Lab 02/13/17 2041 02/14/17 0136 02/14/17 0900  TROPONINI 0.84* 0.58* 0.39*    Recent Labs Lab 02/13/17 1316 02/13/17 1553  TROPIPOC 0.31* 0.70*     BNP Recent Labs Lab 02/13/17 1258  BNP 259.5*     DDimer No results for input(s): DDIMER in the last 168 hours.   Radiology    Dg Chest 2 View  Result Date: 02/13/2017 CLINICAL DATA:  SOB, near syncope, tachycardia, vomiting this morning. Hx of CHF, hypertension, atrial flutter, cancer. Nonsmoker. EXAM: CHEST  2 VIEW COMPARISON:  None. FINDINGS: The cardiac silhouette is mildly enlarged. No mediastinal or hilar masses. There is no evidence of adenopathy. Clear lungs.  No pleural effusion or pneumothorax. Skeletal structures are intact. IMPRESSION: No acute cardiopulmonary disease. Electronically Signed   By: Lajean Manes M.D.   On: 02/13/2017 13:46    Cardiac Studies   EF <25%  Patient Profile     61 y.o. male with cardiomyopathy, atrial flutter, elevated troponin  Assessment & Plan    Elevated troponin:  COntinue IV heparin. Plan for cath when Cr improves.  Gentle hydration at this time.  Diet ordered.  NPO p MN tonight.  Continue medical therapy for CHF.  No ACE-I due to renal insufficiency.  Atrial flutter.  ON IV Amio.  COntinue for now.  Look to stop amio tomorrow. May need longterm anticoagulation givn LV dysfunction.  Signed, Larae Grooms, MD  02/14/2017, 11:24 AM

## 2017-02-15 NOTE — Progress Notes (Signed)
ANTICOAGULATION CONSULT NOTE - Follow-up Consult  Pharmacy Consult for heparin Indication: atrial fibrillation  No Known Allergies  Patient Measurements: Height: 6' (182.9 cm) Weight: 252 lb 8 oz (114.5 kg) IBW/kg (Calculated) : 77.6 Heparin Dosing Weight: 100.6 kg  Vital Signs: Temp: 97.7 F (36.5 C) (02/27 0727) Temp Source: Oral (02/27 0727) BP: 117/86 (02/27 0947) Pulse Rate: 0 (02/27 0947)  Labs:  Recent Labs  02/13/17 1258 02/13/17 2041 02/13/17 2234 02/14/17 0136 02/14/17 0900 02/15/17 0409  HGB 17.0  --   --  15.3  --  14.7  HCT 47.6  --   --  43.1  --  41.4  PLT 187  --   --  179  --  174  LABPROT 15.2  --   --   --   --   --   INR 1.19  --   --   --   --   --   HEPARINUNFRC  --   --  0.57 0.51  --  0.43  CREATININE 2.08*  --   --  1.92*  --  1.55*  TROPONINI  --  0.84*  --  0.58* 0.39*  --     Estimated Creatinine Clearance: 67.9 mL/min (by C-G formula based on SCr of 1.55 mg/dL (H)).  Assessment: 61 y.o. on heparin gtt for chest pain/afib.  Pt is s/p cath this morning with normal coronaries but noted EF 25%.  Plan to restart heparin post procedure awaiting EP eval and plans for oral anticoagulation for afib.  Heparin level was therapeutic on gtt at 1300 units/hr. Radial cath completed and sheath removed at 0943.  Goal of Therapy:  Heparin level 0.3-0.7 units/ml Monitor platelets by anticoagulation protocol: Yes   Plan:  -Heparin at 1300 units/hr -- start at 1800 2/27 -Daily heparin level, CBC  Rabab Currington, Pharm.D., BCPS Clinical Pharmacist Pager [phone] 02/15/2017 1:21 PM

## 2017-02-15 NOTE — Interval H&P Note (Signed)
History and Physical Interval Note:  02/15/2017 9:03 AM  Michael Hubbard  has presented today for surgery, with the diagnosis of atrial flutter with rapid ventricular rate, dilated cardiomyopathy, elevated troponin (cannot exclude ACS).   The various methods of treatment have been discussed with the patient and family. After consideration of risks, benefits and other options for treatment, the patient has consented to  Procedure(s): Left Heart Cath and Coronary Angiography (N/A) with possible percutaneous coronary intervention as a surgical intervention .  The patient's history has been reviewed, patient examined, no change in status, stable for surgery.  I have reviewed the patient's chart and labs.  Questions were answered to the patient's satisfaction.    Cath Lab Visit (complete for each Cath Lab visit)  Clinical Evaluation Leading to the Procedure:   ACS: Yes.    Non-ACS:    Anginal Classification: CCS II  Anti-ischemic medical therapy: Minimal Therapy (1 class of medications)  Non-Invasive Test Results: No non-invasive testing performed - EF by Echo ~25%  Prior CABG: No previous CABG   Glenetta Hew

## 2017-02-15 NOTE — Progress Notes (Signed)
Progress Note  Patient Name: Michael Hubbard Date of Encounter: 02/15/2017  Primary Cardiologist: [facility]   Subjective   No chest pain.   No prior cardiac cath.  Followed at [facility] after losing belief in the care he received in [place] for cardiac care and prostate cancer, for several reasons.  Inpatient Medications    Scheduled Meds: . [MAR Hold] aspirin  325 mg Oral Daily  . [MAR Hold] atorvastatin  80 mg Oral QHS  . [MAR Hold] efavirenz-emtricitabine-tenofovir  1 tablet Oral Daily  . [MAR Hold] valACYclovir  500 mg Oral Daily   Continuous Infusions: . sodium chloride 75 mL/hr at 02/14/17 1700  . amiodarone 30 mg/hr (02/15/17 0547)  . heparin 1,300 Units/hr (02/15/17 0547)   PRN Meds: [MAR Hold] acetaminophen, [MAR Hold] ondansetron (ZOFRAN) IV   Vital Signs    Vitals:   02/14/17 2320 02/15/17 0300 02/15/17 0727 02/15/17 0900  BP: 98/70 112/76 (!) 127/91   Pulse: (!) 101 97 95   Resp:      Temp: 98.7 F (37.1 C) 98.2 F (36.8 C) 97.7 F (36.5 C)   TempSrc: Oral Oral Oral   SpO2: 100% 96% 100% 99%  Weight:  252 lb 8 oz (114.5 kg)    Height:        Intake/Output Summary (Last 24 hours) at 02/15/17 0903 Last data filed at 02/14/17 1700  Gross per 24 hour  Intake             1057 ml  Output                0 ml  Net             1057 ml   Filed Weights   02/13/17 1959 02/14/17 0326 02/15/17 0300  Weight: 249 lb 1.9 oz (113 kg) 246 lb 4.8 oz (111.7 kg) 252 lb 8 oz (114.5 kg)    Telemetry    NSR - Personally Reviewed  ECG    Initially atrial flutter - Personally Reviewed  Physical Exam   GEN: No acute distress.   Neck: No JVD Cardiac: RRR, no murmurs, rubs, or gallops.  Respiratory: Clear to auscultation bilaterally. GI: Soft, nontender, non-distended  MS: No edema; No deformity. Neuro:  Nonfocal  Psych: Normal affect   Labs    Chemistry  Recent Labs Lab 02/13/17 1258 02/14/17 0136 02/15/17 0409  NA 139 137 138  K 2.8* 3.0* 3.7  CL 98*  100* 104  CO2 31 28 26   GLUCOSE 130* 116* 109*  BUN 22* 21* 19  CREATININE 2.08* 1.92* 1.55*  CALCIUM 9.1 8.7* 9.1  PROT 6.9  --   --   ALBUMIN 3.3*  --   --   AST 42*  --   --   ALT 28  --   --   ALKPHOS 94  --   --   BILITOT 1.5*  --   --   GFRNONAA 33* 37* 48*  GFRAA 39* 43* 55*  ANIONGAP 10 9 8      Hematology  Recent Labs Lab 02/13/17 1258 02/14/17 0136 02/15/17 0409  WBC 10.7* 9.3 6.4  RBC 5.87* 5.34 5.10  HGB 17.0 15.3 14.7  HCT 47.6 43.1 41.4  MCV 81.1 80.7 81.2  MCH 29.0 28.7 28.8  MCHC 35.7 35.5 35.5  RDW 14.9 15.0 14.9  PLT 187 179 174    Cardiac Enzymes  Recent Labs Lab 02/13/17 2041 02/14/17 0136 02/14/17 0900  TROPONINI 0.84* 0.58* 0.39*  Recent Labs Lab 02/13/17 1316 02/13/17 1553  TROPIPOC 0.31* 0.70*     BNP  Recent Labs Lab 02/13/17 1258  BNP 259.5*     DDimer No results for input(s): DDIMER in the last 168 hours.   Radiology    Dg Chest 2 View  Result Date: 02/13/2017 CLINICAL DATA:  SOB, near syncope, tachycardia, vomiting this morning. Hx of CHF, hypertension, atrial flutter, cancer. Nonsmoker. EXAM: CHEST  2 VIEW COMPARISON:  None. FINDINGS: The cardiac silhouette is mildly enlarged. No mediastinal or hilar masses. There is no evidence of adenopathy. Clear lungs.  No pleural effusion or pneumothorax. Skeletal structures are intact. IMPRESSION: No acute cardiopulmonary disease. Electronically Signed   By: Lajean Manes M.D.   On: 02/13/2017 13:46    Cardiac Studies   EF <25%  Patient Profile     61 y.o. male with cardiomyopathy, atrial flutter, elevated troponin  Assessment & Plan    Elevated troponin:  COntinue IV heparin. Plan for cath today as Cr improved.  KVO fluid given CHF.   NPO.  Continue medical therapy for CHF.  No ACE-I due to renal insufficiency.  Atrial flutter.  ON IV Amio.  Stop amio today. May need longterm anticoagulation givn LV dysfunction.  Pradaxa would be drug of choice due to HIV meds.   He will likely followup somewhere other than here longterm.  WIll have to get records to that place.  No Vgram. COnsider Synergy stent given need for anticoagulation.  Signed, Larae Grooms, MD  02/15/2017, 9:03 AM

## 2017-02-15 NOTE — Consult Note (Signed)
ELECTROPHYSIOLOGY CONSULT NOTE    Patient ID: Michael Hubbard MRN: [id number], DOB/AGE: June 28, 1956 61 y.o.  Admit date: 02/13/2017 Date of Consult: 02/15/2017  Primary Physician: No primary care provider on file. Primary Cardiologist: [facility]  Reason for Consultation: atrial arrhythmias and NICM  HPI:  Michael Hubbard is a 61 y.o. male with a past medical history significant for NICM, CKD, chronic systolic heart failure, prostate cancer, and HIV.  He was diagnosed with heart failure in August of 2017 while being seen in [place] and subsequently transferred his care to [place].  He presented for evaluation of nausea and vomiting.  He was found to be in atrial flutter with RVR and initially placed on amiodarone which converted him to SR.  He has a home in [place] and [place] but would prefer to [facility].   Echo 01/2017 demonstrated EF <25%, moderate MR, severe LAE, mild to moderate TR, mild pulmonary hypertension.  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 He has not had frank syncope.  As we were talking, he was concerned about a toothache and did not want to continue discussing his cardiac issues.   Past Medical History:  Diagnosis Date  . Atrial flutter ([place])   . Cancer ([place])   . HIV (human immunodeficiency virus infection) ([place])   . Hypertension   . Prostate cancer [facility])      Surgical History:  Past Surgical History:  Procedure Laterality Date  . BACK SURGERY    . CARPAL TUNNEL RELEASE    . LEFT HEART CATH AND CORONARY ANGIOGRAPHY N/A 02/15/2017   Procedure: Left Heart Cath and Coronary Angiography;  Surgeon: Leonie Man, MD;  Location: [facility];  Service: Cardiovascular;  Laterality: N/A;     Prescriptions Prior to Admission  Medication Sig Dispense Refill Last Dose  . aspirin 325 MG tablet Take 325 mg by mouth daily.   02/12/2017 at Unknown time  .  atorvastatin (LIPITOR) 80 MG tablet Take 80 mg by mouth daily.   02/12/2017 at Unknown time  . ATRIPLA 600-200-300 MG per tablet Take 1 tablet by mouth daily.  4 02/12/2017 at Unknown time  . carvedilol (COREG) 6.25 MG tablet Take 6.25 mg by mouth 2 (two) times daily.   02/12/2017 at 1900  . furosemide (LASIX) 40 MG tablet Take 40 mg by mouth 2 (two) times daily.  0 02/12/2017 at Unknown time  . losartan (COZAAR) 50 MG tablet Take 50 mg by mouth daily.   02/12/2017 at Unknown time  . losartan-hydrochlorothiazide (HYZAAR) 100-12.5 MG per tablet Take 1 tablet by mouth daily.  0 02/12/2017 at Unknown time  . metoprolol succinate (TOPROL-XL) 50 MG 24 hr tablet Take 50 mg by mouth daily.   02/12/2017 at ?  Marland Kitchen oxyCODONE-acetaminophen (PERCOCET) 10-325 MG per tablet Take 1 tablet by mouth every 8 (eight) hours as needed for pain. for pain  0 02/12/2017 at Unknown time  . valACYclovir (VALTREX) 500 MG tablet Take 500 mg by mouth daily.   4 02/12/2017 at Unknown time    Inpatient Medications:  . aspirin  325 mg Oral Daily  . atorvastatin  80 mg Oral QHS  . efavirenz-emtricitabine-tenofovir  1 tablet Oral Daily  . potassium chloride  40 mEq Oral Once  . sodium chloride flush  3 mL Intravenous Q12H  . valACYclovir  500 mg Oral Daily    Allergies: No Known  Allergies  Social History   Social History  . Marital status: Widowed    Spouse name: N/A  . Number of children: N/A  . Years of education: N/A   Occupational History  . Not on file.   Social History Main Topics  . Smoking status: Never Smoker  . Smokeless tobacco: Never Used  . Alcohol use Yes     Comment: occasionally  . Drug use: No  . Sexual activity: Not on file   Other Topics Concern  . Not on file   Social History Narrative  . No narrative on file     Family History  Problem Relation Age of Onset  . CAD Father   . Diabetes Brother      Review of Systems: All other systems reviewed and are otherwise negative except as noted  above.  Physical Exam: Vitals:   02/15/17 0934 02/15/17 0939 02/15/17 0942 02/15/17 0947  BP: 121/87 113/83 115/84 117/86  Pulse: 87 85 84 (!) 0  Resp: 20 (!) 23 (!) 21 (!) 0  Temp:      TempSrc:      SpO2: 99% 99% 99% (!) 0%  Weight:      Height:        GEN- The patient is well appearing, alert and oriented x 3 today.   HEENT: normocephalic, atraumatic; sclera clear, conjunctiva pink; hearing intact; oropharynx clear; neck supple Lungs- Clear to ausculation bilaterally, normal work of breathing.  No wheezes, rales, rhonchi Heart- Regular rate and rhythm, no murmurs, rubs or gallops GI- soft, non-tender, non-distended, bowel sounds present Extremities- no clubbing, cyanosis, or edema  MS- no significant deformity or atrophy Skin- warm and dry, no rash or lesion Psych- euthymic mood, full affect Neuro- strength and sensation are intact  Labs:   Lab Results  Component Value Date   WBC 6.4 02/15/2017   HGB 14.7 02/15/2017   HCT 41.4 02/15/2017   MCV 81.2 02/15/2017   PLT 174 02/15/2017    Recent Labs Lab 02/13/17 1258  02/15/17 0409  NA 139  < > 138  K 2.8*  < > 3.7  CL 98*  < > 104  CO2 31  < > 26  BUN 22*  < > 19  CREATININE 2.08*  < > 1.55*  CALCIUM 9.1  < > 9.1  PROT 6.9  --   --   BILITOT 1.5*  --   --   ALKPHOS 94  --   --   ALT 28  --   --   AST 42*  --   --   GLUCOSE 130*  < > 109*  < > = values in this interval not displayed.    Radiology/Studies: Dg Chest 2 View Result Date: 02/13/2017 CLINICAL DATA:  SOB, near syncope, tachycardia, vomiting this morning. Hx of CHF, hypertension, atrial flutter, cancer. Nonsmoker. EXAM: CHEST  2 VIEW COMPARISON:  None. FINDINGS: The cardiac silhouette is mildly enlarged. No mediastinal or hilar masses. There is no evidence of adenopathy. Clear lungs.  No pleural effusion or pneumothorax. Skeletal structures are intact. IMPRESSION: No acute cardiopulmonary disease. Electronically Signed   By: Lajean Manes M.D.   On:  02/13/2017 13:46    EKG: atypical atrial flutter, V rate 128  TELEMETRY: atrial flutter, atrial fibrillation, short RP tachycardia, SR   Assessment/Plan: 1.  Atrial arrhythmias The patient has a variety of atrial arrhythmias that have been asymptomatic He has documented atypical atrial flutter, atrial fibrillation, and a short RP tachycardia  He has severe LA enlargement by recent echo Because of several different arrhythmias and LAE, I think AAD therapy is reasonable to attempt. I do not think that his success rates with ablation are very good. Discussed with pharmacy, with HIV medications, options for AAD therapy are limited. Per their review, amiodarone is best long term option with close monitoring of LFT's.   Would convert IV amiodarone to po (200mg  bid x2 weeks then 200mg  daily) when this bag complete  Continue long term [place] for CHADS2VASC of 2  2.  NICM Diagnosed 2017 Cath this admission without CAD Continue medical therapy As atrial arrhythmias are largely asymptomatic, this could be tachycardia mediated cardiomyopathy. Would pursue restoration of SR then reassess EF in 3 months If EF still depressed at that time, can consider primary prevention ICD  3.  Moderate MR May benefit from further evaluation by TEE If MR is more severe, he could be considered for MVR/MAZE/LAA ligation Will defer to primary cardiology   4.  HIV Per primary team   5.  Prostate cancer Followed at [facility]  Dr Lovena Le to see later today.     Signed, Chanetta Marshall, NP 02/15/2017 12:24 PM  EP Attending  Patient seen and examined. Agree with above. He has a non-ischemic CM which may be HIV or tachy or viral. He has gone back to NSR. His CHF symptoms have been fairly well controlled. He has not had syncope. His exam reveals a RRR with clear lungs and no edema and normal neurologically. Tele reveals NSR and ECG shows NSR and typical atrial flutter.  A/P 1. Atrial flutter/fib and tachycardia - continue  amio switching to 200 mg bid 2. Chronic systolic heart failure - continue after load reduction and coreg. 3. Sudden death prevention - there is a question of compliance. If he can be maintained in NSR and if he will return to the clinic for clinic visits and his EF remains less than 35%, then would consider ICD insertion.   Mikle Bosworth.D.

## 2017-02-16 DIAGNOSIS — I10 Essential (primary) hypertension: Secondary | ICD-10-CM

## 2017-02-16 LAB — CBC
HEMATOCRIT: 38.4 % — AB (ref 39.0–52.0)
Hemoglobin: 13.5 g/dL (ref 13.0–17.0)
MCH: 28.7 pg (ref 26.0–34.0)
MCHC: 35.2 g/dL (ref 30.0–36.0)
MCV: 81.5 fL (ref 78.0–100.0)
Platelets: 162 10*3/uL (ref 150–400)
RBC: 4.71 MIL/uL (ref 4.22–5.81)
RDW: 15 % (ref 11.5–15.5)
WBC: 5.9 10*3/uL (ref 4.0–10.5)

## 2017-02-16 LAB — BASIC METABOLIC PANEL
Anion gap: 6 (ref 5–15)
BUN: 17 mg/dL (ref 6–20)
CALCIUM: 9.3 mg/dL (ref 8.9–10.3)
CO2: 28 mmol/L (ref 22–32)
CREATININE: 1.67 mg/dL — AB (ref 0.61–1.24)
Chloride: 103 mmol/L (ref 101–111)
GFR calc non Af Amer: 44 mL/min — ABNORMAL LOW (ref >60)
GFR, EST AFRICAN AMERICAN: 51 mL/min — AB (ref >60)
GLUCOSE: 109 mg/dL — AB (ref 65–99)
Potassium: 3.8 mmol/L (ref 3.5–5.1)
Sodium: 137 mmol/L (ref 135–145)

## 2017-02-16 MED ORDER — DABIGATRAN ETEXILATE MESYLATE 150 MG PO CAPS
150.0000 mg | ORAL_CAPSULE | Freq: Two times a day (BID) | ORAL | Status: DC
  Administered 2017-02-16: 150 mg via ORAL
  Filled 2017-02-16: qty 1

## 2017-02-16 MED ORDER — AMIODARONE HCL 200 MG PO TABS: ORAL_TABLET | ORAL | 1 refills | Status: AC

## 2017-02-16 MED ORDER — CARVEDILOL 6.25 MG PO TABS: 6.2500 mg | ORAL_TABLET | Freq: Two times a day (BID) | ORAL | Status: DC

## 2017-02-16 MED ORDER — DABIGATRAN ETEXILATE MESYLATE 150 MG PO CAPS: 150.0000 mg | ORAL_CAPSULE | Freq: Two times a day (BID) | ORAL | 0 refills | Status: AC

## 2017-02-16 MED ORDER — CARVEDILOL 6.25 MG PO TABS
6.2500 mg | ORAL_TABLET | Freq: Two times a day (BID) | ORAL | Status: DC
  Administered 2017-02-16: 6.25 mg via ORAL
  Filled 2017-02-16: qty 1

## 2017-02-16 NOTE — Progress Notes (Signed)
Patient being discharged home per MD order. All discharge paperwork given to patient, he verbalized understanding. Written prescription given to patient with discharge instructions.

## 2017-02-16 NOTE — Progress Notes (Signed)
MEDICATION RELATED CONSULT NOTE  Pharmacy Consult Re: anticoagulation recommendations with HIV medications  Mr. Fran is a 61 yo M on Atripla (Efavirenz, Emtricitabine, and Tenofovir) for HIV.  He also has a hx of aflutter with needs to start anticoagulation.  Atripla is contraindicated with all the NOACs.  The drug interaction decreases the concentrations of the NOACs limiting their effectiveness.  Warfarin is labeled as extreme caution based on the efavarinz drug interaction that can increase warfarin concentrations.  Pradaxa is the only anticoagulant that is safe/appropriate with Atripla therapy.   I have also discussed this information with our [facility] pharmacists.  An alternative option is to discuss with his outpatient provider about changing his HIV therapy.  This information was also discussed with Dr. Ree Kida on 02/14/27.  Thanks, [employer], Pharm.D., BCPS Clinical Pharmacist Pager [phone] 02/16/2017 10:05 AM

## 2017-02-16 NOTE — Discharge Summary (Signed)
Physician Discharge Summary  Michael Hubbard [id number] DOB: 15-Jun-1956 DOA: 02/13/2017  PCP: No primary care provider on file.  Admit date: 02/13/2017 Discharge date: 02/16/2017  Recommendations for Outpatient Follow-up:  1. Pt will need to follow up with PCP in 1-2 weeks post discharge 2. Please obtain BMP to evaluate electrolytes and kidney function 3. Please also check CBC to evaluate Hg and Hct levels 4. Continue Amiodarone 400 mg daily for a month.  Decrease to 200 mg daily after that.   5. Pradaxa 150 mg BID for stroke prevention.   6. For NICM, started Coreg 6.25 BID.  Will need hydralazine and nitrates as an outpatient.   7. Plan to restart ARB when renal function settles.   8. Recheck echo in 3 months.. IF EF low, would need referral for AICD.  9. Please note Metoprolol, Hyzaar, losartan due to hypotension and AKI 10. Lasix resumed due to worsening LE edema   Discharge Diagnoses:  Principal Problem:   Atrial flutter with rapid ventricular response (HCC) Active Problems:   Elevated troponin   Chronic combined systolic and diastolic heart failure, NYHA class 2 (HCC)   Hypotension   CKD (chronic kidney disease) stage 2, GFR 60-89 ml/min   AKI (acute kidney injury) ([place])   Atrial flutter (HCC)   Essential hypertension   Hyperlipidemia   Lactic acidosis   Atrial fibrillation with RVR ([place])  Discharge Condition: Stable  Diet recommendation: Heart healthy diet discussed in details   Brief Narrative:  61 y.o.malewith a medical history of atrial flutter, CHF, prostate cancer, hypertension, HIV, who presented to the emergency department with complaints of feeling lightheaded. Patient also experienced one episode of nausea and vomiting, as well as diaphoresis.  Interim history S/p Cath, medical management. EF 15-20%.   Assessment & Plan   Atrial flutter with RVR -Unknown cause, patient states this has occurred in the past, but was only placed on aspirin -Was  started on cardizem drip in the ER, however became hypotensive -Cardiology consulted and appreciated -Was placed on amiodarone and heparin drips. Both discontinued. -TSH 0.498, Magnesium 2.1, phos 2.8 (WNL) -CHADSVASC 2 -Given HIV, oral AC would have to be pradaxa -S/p cardiac catheterization, recommended medical management, EF 15-20% with global hypokinesis.  -coreg added per cardiology  -please see recommendations above   Elevated troponin -Possibly related to the above, demand ischemia -Patient denies any chest pain at this time and wants to go home   Acute kidney injury on chronic kidney disease, stage II -Baseline creatinine approximately 1.5, upon admission 2.08 -Patient was on multiple ARB's as well as diuretics. -ARB on hold until renal function stabilizes   Transient hypotension -Likely secondary to Cardizem, patient's blood pressure did drop to 89/72 however rebounded with IV fluids  Elevated lactic acid -Lactic acid 2.62, likely secondary to the above along with dehydration as patient did have episode of nausea with vomiting prior to coming to the emergency department. -Resolved, lactic acid 1.5  Chronic systolic heart failure -Patient states he sees a cardiologist in [place] and withdrawn as well as a [employer] -Last echocardiogram February 15,2018 showed an EF of <25% -Patient currently with some LE swelling but no crackles on exam  -resume lasix per home medical regimen   Hypokalemia -Likely secondary to diuresis and possible GI losses -supplemented and WNL   HIV -Continue Atripla -patient follows with a physician in [place], [place]  Hyperlipidemia -Continue statin -Lipid panel: TC 113, HDL 39, LDL 63, TG 56  Essential hypertension -  Given AKI and hypotension, continue to hold losartan, Hyzaar, metoprolol -with LE edema, will resume home regimen with lasix    History prostate cancer -Stable, follows with physician at [facility]. Has PSA  monitored   DVT Prophylaxis  started on Pradaxa   Code Status: Full  Family Communication: None at bedside  Disposition Plan: Home  Consultants Cardiology  Procedures  Cardiac catheterization  Procedures/Studies: Dg Chest 2 View  Result Date: 02/13/2017 CLINICAL DATA:  SOB, near syncope, tachycardia, vomiting this morning. Hx of CHF, hypertension, atrial flutter, cancer. Nonsmoker. EXAM: CHEST  2 VIEW COMPARISON:  None. FINDINGS: The cardiac silhouette is mildly enlarged. No mediastinal or hilar masses. There is no evidence of adenopathy. Clear lungs.  No pleural effusion or pneumothorax. Skeletal structures are intact. IMPRESSION: No acute cardiopulmonary disease. Electronically Signed   By: Lajean Manes M.D.   On: 02/13/2017 13:46     Discharge Exam: Vitals:   02/16/17 0500 02/16/17 0800  BP: 128/88   Pulse: 91   Resp: 18 17  Temp: 98.9 F (37.2 C)    Vitals:   02/15/17 1715 02/15/17 2006 02/16/17 0500 02/16/17 0800  BP: 112/89 (!) 128/93 128/88   Pulse: 99 85 91   Resp: 18 20 18 17   Temp: 98.6 F (37 C) 98.3 F (36.8 C) 98.9 F (37.2 C)   TempSrc: Oral Oral Oral   SpO2: 99% 100% 97% 96%  Weight:      Height:        General: Pt is alert, follows commands appropriately, not in acute distress Cardiovascular: Irregular rate and rhythm,  no rubs, no gallops Respiratory: Clear to auscultation bilaterally, no wheezing, no crackles, no rhonchi Abdominal: Soft, non tender, non distended, bowel sounds +, no guarding Extremities: LE edema +1 bilaterally, no cyanosis, pulses palpable bilaterally DP and PT Neuro: Grossly nonfocal  Discharge Instructions  Discharge Instructions    Diet - low sodium heart healthy    Complete by:  As directed    Increase activity slowly    Complete by:  As directed      Allergies as of 02/16/2017   No Known Allergies     Medication List    STOP taking these medications   losartan 50 MG tablet Commonly known as:   COZAAR   losartan-hydrochlorothiazide 100-12.5 MG tablet Commonly known as:  HYZAAR   metoprolol succinate 50 MG 24 hr tablet Commonly known as:  TOPROL-XL     TAKE these medications   amiodarone 200 MG tablet Commonly known as:  PACERONE Take 400 mg tablet once daily for one month and upon completion, resume 200 mg table once daily   aspirin 325 MG tablet Take 325 mg by mouth daily.   atorvastatin 80 MG tablet Commonly known as:  LIPITOR Take 80 mg by mouth daily.   ATRIPLA 600-200-300 MG tablet Generic drug:  efavirenz-emtricitabine-tenofovir Take 1 tablet by mouth daily.   carvedilol 6.25 MG tablet Commonly known as:  COREG Take 6.25 mg by mouth 2 (two) times daily.   dabigatran 150 MG Caps capsule Commonly known as:  PRADAXA Take 1 capsule (150 mg total) by mouth every 12 (twelve) hours.   furosemide 40 MG tablet Commonly known as:  LASIX Take 40 mg by mouth 2 (two) times daily.   oxyCODONE-acetaminophen 10-325 MG tablet Commonly known as:  PERCOCET Take 1 tablet by mouth every 8 (eight) hours as needed for pain. for pain   valACYclovir 500 MG tablet Commonly known as:  VALTREX Take  500 mg by mouth daily.         The results of significant diagnostics from this hospitalization (including imaging, microbiology, ancillary and laboratory) are listed below for reference.     Microbiology: Recent Results (from the past 240 hour(s))  Urine culture     Status: None   Collection Time: 02/13/17  1:07 PM  Result Value Ref Range Status   Specimen Description URINE, RANDOM  Final   Special Requests NONE  Final   Culture NO GROWTH  Final   Report Status 02/14/2017 FINAL  Final  MRSA PCR Screening     Status: None   Collection Time: 02/13/17  8:09 PM  Result Value Ref Range Status   MRSA by PCR NEGATIVE NEGATIVE Final    Comment:        The GeneXpert MRSA Assay (FDA approved for NASAL specimens only), is one component of a comprehensive MRSA  colonization surveillance program. It is not intended to diagnose MRSA infection nor to guide or monitor treatment for MRSA infections.      Labs: Basic Metabolic Panel:  Recent Labs Lab 02/13/17 1258 02/13/17 2041 02/14/17 0136 02/14/17 0900 02/15/17 0409 02/16/17 0440  NA 139  --  137  --  138 137  K 2.8*  --  3.0*  --  3.7 3.8  CL 98*  --  100*  --  104 103  CO2 31  --  28  --  26 28  GLUCOSE 130*  --  116*  --  109* 109*  BUN 22*  --  21*  --  19 17  CREATININE 2.08*  --  1.92*  --  1.55* 1.67*  CALCIUM 9.1  --  8.7*  --  9.1 9.3  MG 2.1 1.9  --   --   --   --   PHOS  --   --   --  2.8  --   --    Liver Function Tests:  Recent Labs Lab 02/13/17 1258  AST 42*  ALT 28  ALKPHOS 94  BILITOT 1.5*  PROT 6.9  ALBUMIN 3.3*    Recent Labs Lab 02/13/17 1258  LIPASE 39   CBC:  Recent Labs Lab 02/13/17 1258 02/14/17 0136 02/15/17 0409 02/16/17 0440  WBC 10.7* 9.3 6.4 5.9  NEUTROABS 8.5*  --   --   --   HGB 17.0 15.3 14.7 13.5  HCT 47.6 43.1 41.4 38.4*  MCV 81.1 80.7 81.2 81.5  PLT 187 179 174 162   Cardiac Enzymes:  Recent Labs Lab 02/13/17 2041 02/14/17 0136 02/14/17 0900  TROPONINI 0.84* 0.58* 0.39*   BNP: BNP (last 3 results)  Recent Labs  02/13/17 1258  BNP 259.5*   SIGNED: Time coordinating discharge: 30 minutes  Faye Ramsay, MD  [facility] 02/16/2017, 1:06 PM Pager [phone]  If 7PM-7AM, please contact night-coverage [email or web address] Password [email or web address]

## 2017-02-16 NOTE — Plan of Care (Signed)
Problem: Health Behavior/Discharge Planning: Goal: Ability to manage health-related needs will improve Pt refused heparin gtt. RN educated on the importance of anticoagulation, yet pt still refused.  He stated that he will "only take a pill".

## 2017-02-16 NOTE — Plan of Care (Signed)
Problem: Pain Managment: Goal: General experience of comfort will improve Outcome: Completed/Met Date Met: 02/16/17 Patient has had no complaints of pain, upon discharge.

## 2017-02-16 NOTE — Discharge Instructions (Signed)
Atrial Fibrillation Atrial fibrillation is a type of irregular or rapid heartbeat (arrhythmia). In atrial fibrillation, the heart quivers continuously in a chaotic pattern. This occurs when parts of the heart receive disorganized signals that make the heart unable to pump blood normally. This can increase the risk for stroke, heart failure, and other heart-related conditions. There are different types of atrial fibrillation, including:  Paroxysmal atrial fibrillation. This type starts suddenly, and it usually stops on its own shortly after it starts.  Persistent atrial fibrillation. This type often lasts longer than a week. It may stop on its own or with treatment.  Long-lasting persistent atrial fibrillation. This type lasts longer than 12 months.  Permanent atrial fibrillation. This type does not go away.  Talk with your health care provider to learn about the type of atrial fibrillation that you have. What are the causes? This condition is caused by some heart-related conditions or procedures, including:  A heart attack.  Coronary artery disease.  Heart failure.  Heart valve conditions.  High blood pressure.  Inflammation of the sac that surrounds the heart (pericarditis).  Heart surgery.  Certain heart rhythm disorders, such as Wolf-Parkinson-White syndrome.  Other causes include:  Pneumonia.  Obstructive sleep apnea.  Blockage of an artery in the lungs (pulmonary embolism, or PE).  Lung cancer.  Chronic lung disease.  Thyroid problems, especially if the thyroid is overactive (hyperthyroidism).  Caffeine.  Excessive alcohol use or illegal drug use.  Use of some medicines, including certain decongestants and diet pills.  Sometimes, the cause cannot be found. What increases the risk? This condition is more likely to develop in:  People who are older in age.  People who smoke.  People who have diabetes mellitus.  People who are overweight  (obese).  Athletes who exercise vigorously.  What are the signs or symptoms? Symptoms of this condition include:  A feeling that your heart is beating rapidly or irregularly.  A feeling of discomfort or pain in your chest.  Shortness of breath.  Sudden light-headedness or weakness.  Getting tired easily during exercise.  In some cases, there are no symptoms. How is this diagnosed? Your health care provider may be able to detect atrial fibrillation when taking your pulse. If detected, this condition may be diagnosed with:  An electrocardiogram (ECG).  A Holter monitor test that records your heartbeat patterns over a 24-hour period.  Transthoracic echocardiogram (TTE) to evaluate how blood flows through your heart.  Transesophageal echocardiogram (TEE) to view more detailed images of your heart.  A stress test.  Imaging tests, such as a CT scan or chest X-ray.  Blood tests.  How is this treated? The main goals of treatment are to prevent blood clots from forming and to keep your heart beating at a normal rate and rhythm. The type of treatment that you receive depends on many factors, such as your underlying medical conditions and how you feel when you are experiencing atrial fibrillation. This condition may be treated with:  Medicine to slow down the heart rate, bring the heart's rhythm back to normal, or prevent clots from forming.  Electrical cardioversion. This is a procedure that resets your heart's rhythm by delivering a controlled, low-energy shock to the heart through your skin.  Different types of ablation, such as catheter ablation, catheter ablation with pacemaker, or surgical ablation. These procedures destroy the heart tissues that send abnormal signals. When the pacemaker is used, it is placed under your skin to help your heart beat in   a regular rhythm.  Follow these instructions at home:  Take over-the counter and prescription medicines only as told by your  health care provider.  If your health care provider prescribed a blood-thinning medicine (anticoagulant), take it exactly as told. Taking too much blood-thinning medicine can cause bleeding. If you do not take enough blood-thinning medicine, you will not have the protection that you need against stroke and other problems.  Do not use tobacco products, including cigarettes, chewing tobacco, and e-cigarettes. If you need help quitting, ask your health care provider.  If you have obstructive sleep apnea, manage your condition as told by your health care provider.  Do not drink alcohol.  Do not drink beverages that contain caffeine, such as coffee, soda, and tea.  Maintain a healthy weight. Do not use diet pills unless your health care provider approves. Diet pills may make heart problems worse.  Follow diet instructions as told by your health care provider.  Exercise regularly as told by your health care provider.  Keep all follow-up visits as told by your health care provider. This is important. How is this prevented?  Avoid drinking beverages that contain caffeine or alcohol.  Avoid certain medicines, especially medicines that are used for breathing problems.  Avoid certain herbs and herbal medicines, such as those that contain ephedra or ginseng.  Do not use illegal drugs, such as cocaine and amphetamines.  Do not smoke.  Manage your high blood pressure. Contact a health care provider if:  You notice a change in the rate, rhythm, or strength of your heartbeat.  You are taking an anticoagulant and you notice increased bruising.  You tire more easily when you exercise or exert yourself. Get help right away if:  You have chest pain, abdominal pain, sweating, or weakness.  You feel nauseous.  You notice blood in your vomit, bowel movement, or urine.  You have shortness of breath.  You suddenly have swollen feet and ankles.  You feel dizzy.  You have sudden weakness or  numbness of the face, arm, or leg, especially on one side of the body.  You have trouble speaking, trouble understanding, or both (aphasia).  Your face or your eyelid droops on one side. These symptoms may represent a serious problem that is an emergency. Do not wait to see if the symptoms will go away. Get medical help right away. Call your local emergency services (911 in the [place] Do not drive yourself to the hospital. This information is not intended to replace advice given to you by your health care provider. Make sure you discuss any questions you have with your health care provider. Document Released: 12/06/2005 Document Revised: 04/14/2016 Document Reviewed: 04/02/2015 Elsevier Interactive Patient Education  2017 Else[employer] Inc[employer]  

## 2017-02-16 NOTE — Care Management Note (Addendum)
Case Management Note  Patient Details  Name: RENZO DAUGHDRILL MRN: [id number] Date of Birth: 1956-08-29  Subjective/Objective:   Pt presented for Atrial Flutter. Consult received for cost of Pradaxa. Benefits Check completed and CM will make pt aware of cost. CM to provide pt with 30 day free card. Pt uses [facility] on [address] has medication available. No further needs at this time.                  Action/Plan: S/W ABI @ [facility] # [phone]   1. PRADAXO 75 MG BID  COVER- YES  CO-PAY- $ 8.35  PRIOR APPROVAL- NO   2. PRADAXA 150 MG BID  SAME AS ABOVE   PHARMACY : [facility]  MAIL-ORDER FOR 90 DAY SUPPLY ALSO $ 8.35  Expected Discharge Date:  02/16/17               Expected Discharge Plan:  Home/Self Care  In-House Referral:  NA  Discharge planning Services  CM Consult, Medication Assistance  Post Acute Care Choice:  NA Choice offered to:  NA  DME Arranged:  N/A DME Agency:  NA  HH Arranged:  NA HH Agency:  NA  Status of Service:  Completed, signed off  If discussed at [place] of Stay Meetings, dates discussed:    Additional Comments:  Bethena Roys, RN 02/16/2017, 1:54 PM

## 2017-02-16 NOTE — Progress Notes (Signed)
Progress Note  Patient Name: Michael Hubbard Date of Encounter: 02/16/2017  Primary Cardiologist: at [facility]   No chest pain.  Feels well.  Wants to go home.    Inpatient Medications    Scheduled Meds: . amiodarone  200 mg Oral BID  . aspirin  325 mg Oral Daily  . atorvastatin  80 mg Oral QHS  . dabigatran  150 mg Oral Q12H  . efavirenz-emtricitabine-tenofovir  1 tablet Oral Daily  . sodium chloride flush  3 mL Intravenous Q12H  . valACYclovir  500 mg Oral Daily   Continuous Infusions:  PRN Meds: sodium chloride, acetaminophen, ondansetron (ZOFRAN) IV, sodium chloride flush   Vital Signs    Vitals:   02/15/17 1715 02/15/17 2006 02/16/17 0500 02/16/17 0800  BP: 112/89 (!) 128/93 128/88   Pulse: 99 85 91   Resp: 18 20 18 17   Temp: 98.6 F (37 C) 98.3 F (36.8 C) 98.9 F (37.2 C)   TempSrc: Oral Oral Oral   SpO2: 99% 100% 97% 96%  Weight:      Height:        Intake/Output Summary (Last 24 hours) at 02/16/17 1143 Last data filed at 02/16/17 [id number]  Gross per 24 hour  Intake              963 ml  Output              350 ml  Net              613 ml   Filed Weights   02/13/17 1959 02/14/17 0326 02/15/17 0300  Weight: 249 lb 1.9 oz (113 kg) 246 lb 4.8 oz (111.7 kg) 252 lb 8 oz (114.5 kg)    Telemetry    NSR- Personally Reviewed  ECG    None new  Physical Exam   GEN: No acute distress.   Neck: No JVD Cardiac: RRR, no murmurs, rubs, or gallops.  Respiratory: Clear to auscultation bilaterally. GI: Soft, nontender, non-distended  MS: No edema; No deformity. No right radial hematoma Neuro:  Nonfocal  Psych: Normal affect   Labs    Chemistry Recent Labs Lab 02/13/17 1258 02/14/17 0136 02/15/17 0409 02/16/17 0440  NA 139 137 138 137  K 2.8* 3.0* 3.7 3.8  CL 98* 100* 104 103  CO2 31 28 26 28   GLUCOSE 130* 116* 109* 109*  BUN 22* 21* 19 17  CREATININE 2.08* 1.92* 1.55* 1.67*  CALCIUM 9.1 8.7* 9.1 9.3  PROT 6.9  --   --   --   ALBUMIN  3.3*  --   --   --   AST 42*  --   --   --   ALT 28  --   --   --   ALKPHOS 94  --   --   --   BILITOT 1.5*  --   --   --   GFRNONAA 33* 37* 48* 44*  GFRAA 39* 43* 55* 51*  ANIONGAP 10 9 8 6      Hematology Recent Labs Lab 02/14/17 0136 02/15/17 0409 02/16/17 0440  WBC 9.3 6.4 5.9  RBC 5.34 5.10 4.71  HGB 15.3 14.7 13.5  HCT 43.1 41.4 38.4*  MCV 80.7 81.2 81.5  MCH 28.7 28.8 28.7  MCHC 35.5 35.5 35.2  RDW 15.0 14.9 15.0  PLT 179 174 162    Cardiac Enzymes Recent Labs Lab 02/13/17 2041 02/14/17 0136 02/14/17 0900  TROPONINI 0.84* 0.58* 0.39*  Recent Labs Lab 02/13/17 1316 02/13/17 1553  TROPIPOC 0.31* 0.70*     BNP Recent Labs Lab 02/13/17 1258  BNP 259.5*     DDimer No results for input(s): DDIMER in the last 168 hours.   Radiology    No results found.  Cardiac Studies   No CAD by cath  Patient Profile     61 y.o. male with NICM, HIV, atrial flutter  Assessment & Plan    1) Atrial flutter: COntinue Amiodarone 400 mg daily for a month.  Decrease to 200 mg daily after that.  Pradaxa 150 mg BID for stroke prevention.    2) For NICM, restart Coreg 6.25 BID.  Will need hydralazine and nitrates as an outpatient.  Restart ARB when renal function settles.   3) Possible discharge later today if we can get his medications arranged.   Recheck echo in 3 months.. IF EF low, would need referral for AICD.   Signed, Larae Grooms, MD  02/16/2017, 11:43 AM

## 2017-02-16 NOTE — Plan of Care (Signed)
Problem: Education: Goal: Knowledge of [facility] Education information/materials will improve Outcome: Progressing Patient has heparin gtt orders , but refuses , medication explained/ educated about reason for med, Pharmacy aware.

## 2017-02-16 NOTE — Plan of Care (Signed)
Problem: Safety: Goal: Ability to remain free from injury will improve Outcome: Adequate for Discharge Patient being discharge per MD order.

## 2017-04-07 IMAGING — DX DG CHEST 2V
2 series · 2 of 2 positions shown · non-contrast
Comparison: None.

CLINICAL DATA: SOB, near syncope, tachycardia, vomiting this
morning. Hx of CHF, hypertension, atrial flutter, cancer. Nonsmoker.

EXAM:
CHEST  2 VIEW

[chest pa]
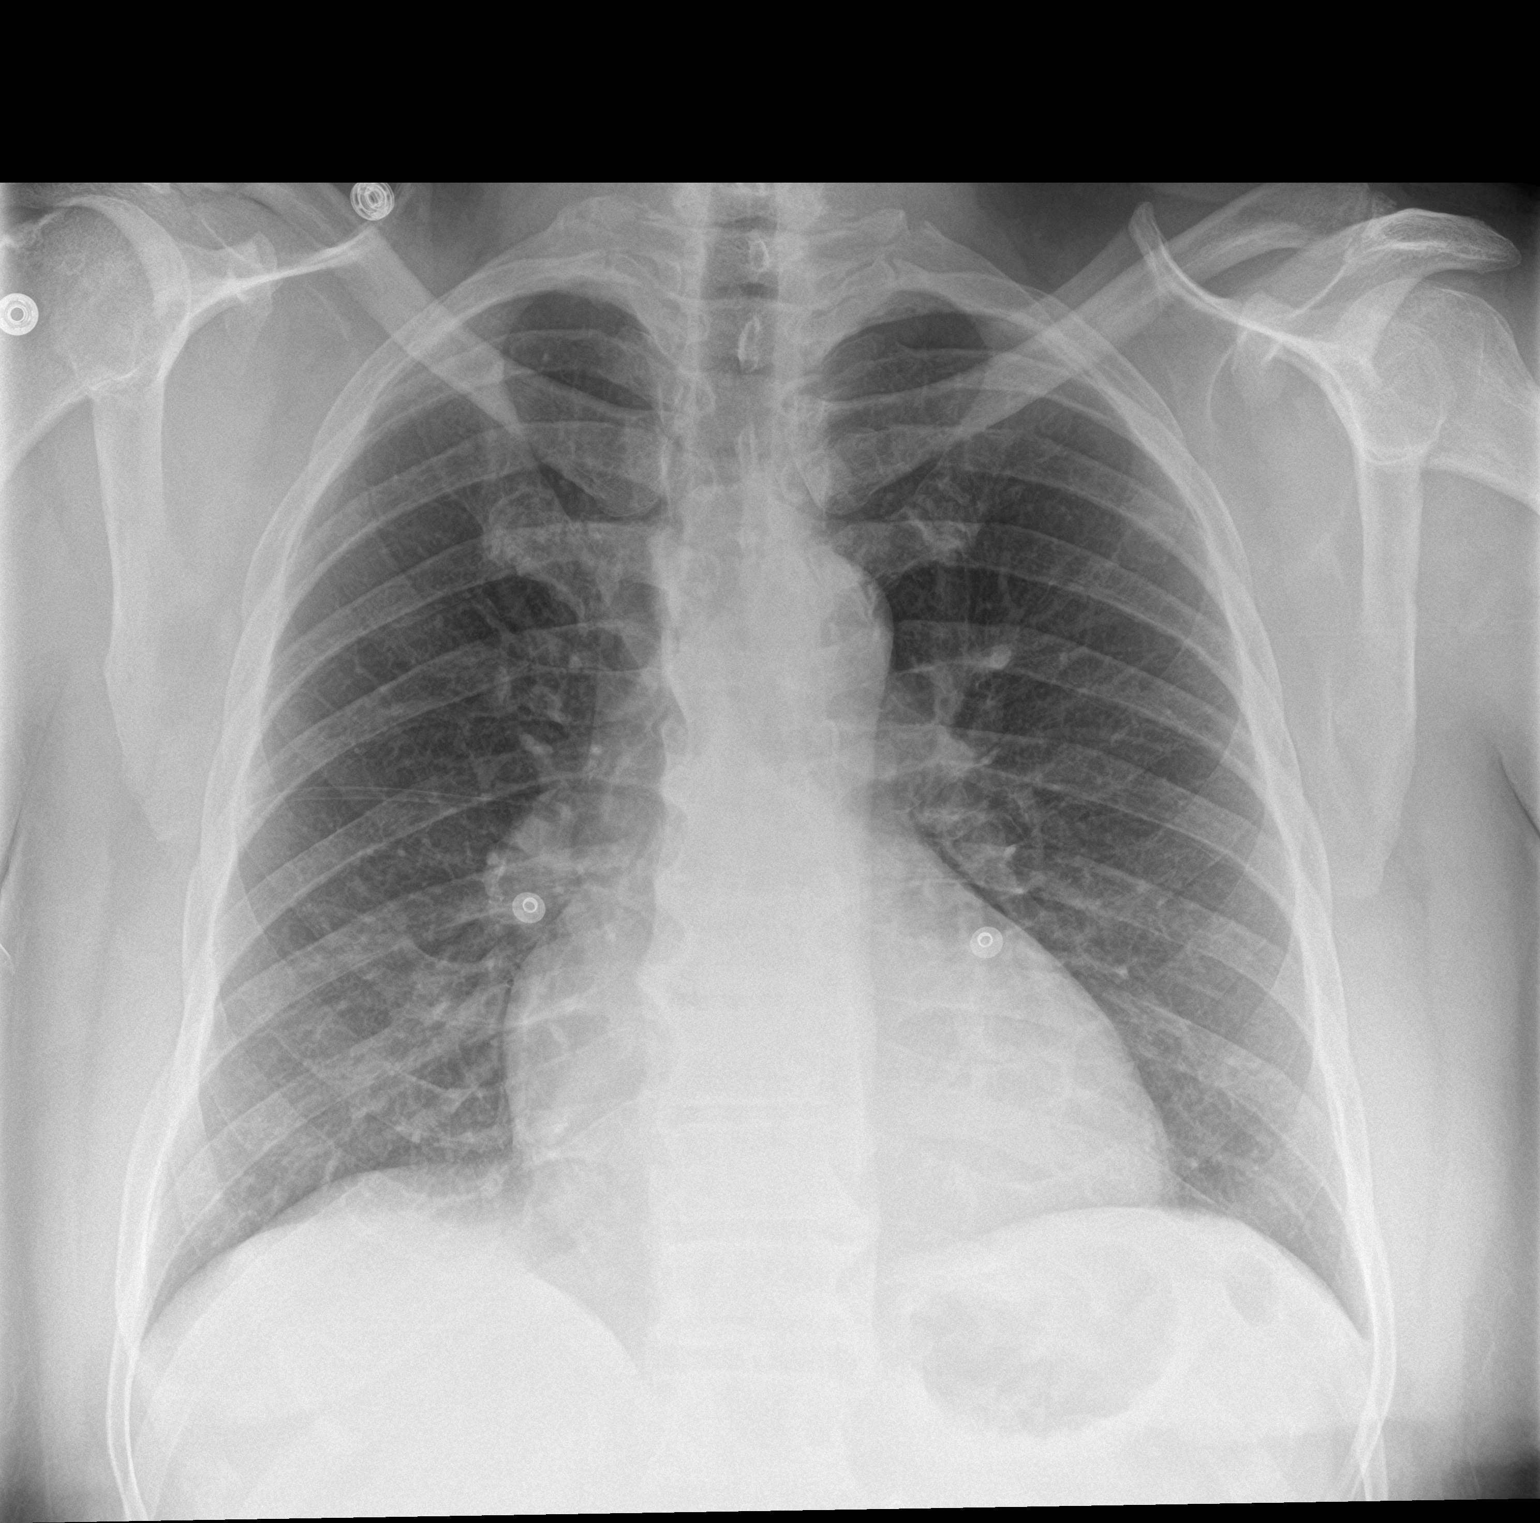

[chest lat]
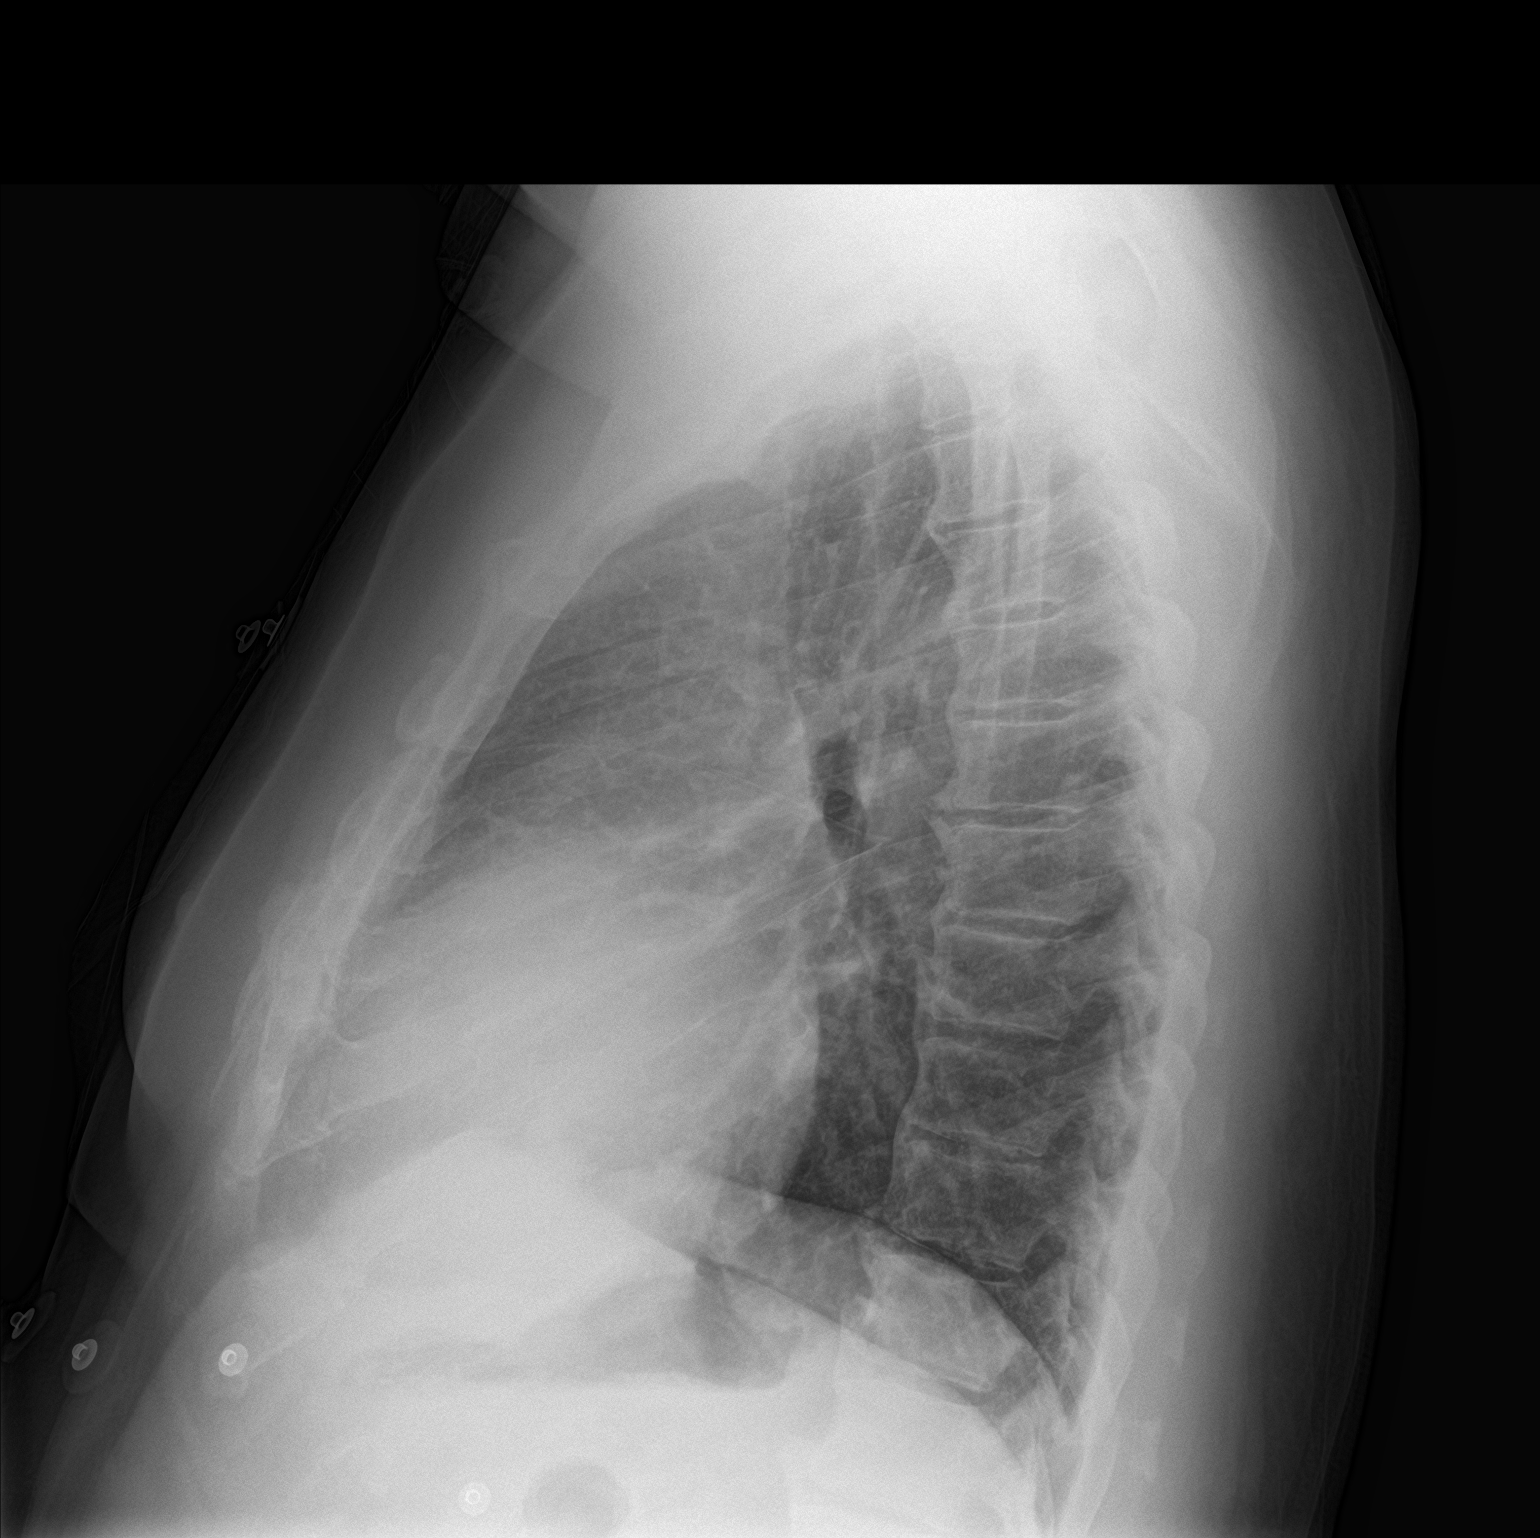

[2 of 2 positions shown; findings below may reference images not displayed]

FINDINGS: The cardiac silhouette is mildly enlarged. No mediastinal or hilar
masses. There is no evidence of adenopathy.

Clear lungs.  No pleural effusion or pneumothorax.

Skeletal structures are intact.
IMPRESSION: No acute cardiopulmonary disease.

## 2017-12-24 ENCOUNTER — Encounter: Payer: Self-pay | Admitting: Physician Assistant

## 2017-12-24 ENCOUNTER — Other Ambulatory Visit: Payer: Self-pay

## 2017-12-24 ENCOUNTER — Ambulatory Visit: Payer: Self-pay | Admitting: Physician Assistant

## 2017-12-24 VITALS — BP 140/100 | HR 90 | Temp 98.3°F | Resp 16 | Ht 70.0 in | Wt 255.6 lb

## 2017-12-24 DIAGNOSIS — Z0289 Encounter for other administrative examinations: Secondary | ICD-10-CM

## 2017-12-24 NOTE — Progress Notes (Signed)
PRIMARY CARE AT [facility] [address], [place] [address] [phone]  Date:  12/24/2017   Name:  Michael Hubbard   DOB:  1956-11-29   MRN:  [id number]  PCP:  Patient, No Pcp Per    History of Present Illness:  Michael Hubbard is a 62 y.o. male patient who presents to PCP with  Chief Complaint  Patient presents with  . DOT PHYSICAL     No concerns or complaints at this time.   Patient Active Problem List   Diagnosis Date Noted  . Atrial fibrillation with RVR ([place])   . Atrial flutter with rapid ventricular response ([place])   . Lactic acidosis   . Elevated troponin 02/13/2017  . Chronic combined systolic and diastolic heart failure, NYHA class 2 ([place]) 02/13/2017  . Hypotension 02/13/2017  . CKD (chronic kidney disease) stage 2, GFR 60-89 ml/min 02/13/2017  . AKI (acute kidney injury) ([place]) 02/13/2017  . Atrial flutter ([place]) 02/13/2017  . Essential hypertension 02/13/2017  . Hyperlipidemia 02/13/2017  . Neck muscle spasm 03/24/2015  . Greater trochanteric bursitis of right hip 03/24/2015    Past Medical History:  Diagnosis Date  . Atrial flutter ([place])   . Cancer ([place])   . HIV (human immunodeficiency virus infection) ([place])   . Hypertension   . Prostate cancer [facility]     Past Surgical History:  Procedure Laterality Date  . BACK SURGERY    . CARPAL TUNNEL RELEASE    . LEFT HEART CATH AND CORONARY ANGIOGRAPHY N/A 02/15/2017   Procedure: Left Heart Cath and Coronary Angiography;  Surgeon: Leonie Man, MD;  Location: [place] [facility];  Service: Cardiovascular;  Laterality: N/A;    Social History   Tobacco Use  . Smoking status: Never Smoker  . Smokeless tobacco: Never Used  Substance Use Topics  . Alcohol use: Yes    Comment: occasionally  . Drug use: No    Family History  Problem Relation Age of Onset  . CAD Father   . Diabetes Brother     No Known Allergies  Medication list has been reviewed and updated.  Current Outpatient Medications on File Prior  to Visit  Medication Sig Dispense Refill  . amiodarone (PACERONE) 200 MG tablet Take 400 mg tablet once daily for one month and upon completion, resume 200 mg table once daily (Patient not taking: Reported on 12/24/2017) 60 tablet 1  . aspirin 325 MG tablet Take 325 mg by mouth daily.    Marland Kitchen atorvastatin (LIPITOR) 80 MG tablet Take 80 mg by mouth daily.    . ATRIPLA 600-200-300 MG per tablet Take 1 tablet by mouth daily.  4  . carvedilol (COREG) 6.25 MG tablet Take 6.25 mg by mouth 2 (two) times daily.    . dabigatran (PRADAXA) 150 MG CAPS capsule Take 1 capsule (150 mg total) by mouth every 12 (twelve) hours. (Patient not taking: Reported on 12/24/2017) 60 capsule 0  . furosemide (LASIX) 40 MG tablet Take 40 mg by mouth 2 (two) times daily.  0  . oxyCODONE-acetaminophen (PERCOCET) 10-325 MG per tablet Take 1 tablet by mouth every 8 (eight) hours as needed for pain. for pain  0  . valACYclovir (VALTREX) 500 MG tablet Take 500 mg by mouth daily.   4   No current facility-administered medications on file prior to visit.     ROS ROS otherwise unremarkable unless listed above.  Physical Examination: BP (!) 140/100 (BP Location: Right Arm, Patient Position: Supine, Cuff  Size: Large)   Pulse 90   Temp 98.3 F (36.8 C) (Oral)   Resp 16   Ht 5\' 10"  (1.778 m)   Wt 255 lb 9.6 oz (115.9 kg)   SpO2 98%   BMI 36.67 kg/m  Ideal Body Weight: Weight in (lb) to have BMI = 25: 173.9  Physical Exam  Constitutional: He is oriented to person, place, and time. He appears well-developed and well-nourished. No distress.  HENT:  Head: Normocephalic and atraumatic.  Eyes: Conjunctivae and EOM are normal. Pupils are equal, round, and reactive to light.  Cardiovascular: Normal rate.  Pulmonary/Chest: Effort normal. No respiratory distress.  Neurological: He is alert and oriented to person, place, and time.  Skin: Skin is warm and dry. He is not diaphoretic.  Psychiatric: He has a normal mood and affect. His  behavior is normal.    Assessment and Plan: Michael Hubbard is a 62 y.o. male who is here today  3 month given.  Must be cleared by cardiology at this time  Encounter for examination required by Department of Transportation (DOT)  Ivar Drape, PA-C [facility] 1/12/20197:45 PM

## 2017-12-24 NOTE — Patient Instructions (Signed)
     IF you received an x-ray today, you will receive an invoice from [facility]. Please contact [facility] at [phone] with questions or concerns regarding your invoice.   IF you received labwork today, you will receive an invoice from [facility]. Please contact [facility] at [phone] with questions or concerns regarding your invoice.   Our billing staff will not be able to assist you with questions regarding bills from these companies.  You will be contacted with the lab results as soon as they are available. The fastest way to get your results is to activate your My Chart account. Instructions are located on the last page of this paperwork. If you have not heard from us regarding the results in 2 weeks, please contact this office.     

## 2018-03-20 ENCOUNTER — Encounter: Payer: Self-pay | Admitting: Physician Assistant
